# Patient Record
Sex: Female | Born: 1935 | Race: White | Hispanic: No | Marital: Married | Smoking: Never smoker
Health system: Southern US, Community
[De-identification: ages and names within clinical notes are randomized; demographics above are authoritative.]

## PROBLEM LIST (undated history)

## (undated) DIAGNOSIS — N390 Urinary tract infection, site not specified: Secondary | ICD-10-CM

## (undated) DIAGNOSIS — I639 Cerebral infarction, unspecified: Secondary | ICD-10-CM

## (undated) DIAGNOSIS — E119 Type 2 diabetes mellitus without complications: Secondary | ICD-10-CM

## (undated) DIAGNOSIS — I10 Essential (primary) hypertension: Secondary | ICD-10-CM

## (undated) HISTORY — PX: ANKLE SURGERY: SHX546

---

## 2001-05-25 ENCOUNTER — Encounter: Admission: RE | Admit: 2001-05-25 | Discharge: 2001-08-23 | Payer: Self-pay | Admitting: Pulmonary Disease

## 2002-01-04 ENCOUNTER — Ambulatory Visit (HOSPITAL_COMMUNITY): Admission: RE | Admit: 2002-01-04 | Discharge: 2002-01-04 | Payer: Self-pay | Admitting: Orthopaedic Surgery

## 2004-06-24 ENCOUNTER — Emergency Department (HOSPITAL_COMMUNITY): Admission: EM | Admit: 2004-06-24 | Discharge: 2004-06-24 | Payer: Self-pay | Admitting: Emergency Medicine

## 2007-10-12 ENCOUNTER — Ambulatory Visit: Payer: Self-pay | Admitting: Internal Medicine

## 2009-03-29 ENCOUNTER — Ambulatory Visit: Payer: Self-pay | Admitting: Internal Medicine

## 2009-03-29 DIAGNOSIS — R32 Unspecified urinary incontinence: Secondary | ICD-10-CM | POA: Insufficient documentation

## 2009-03-29 DIAGNOSIS — I10 Essential (primary) hypertension: Secondary | ICD-10-CM | POA: Insufficient documentation

## 2009-03-29 DIAGNOSIS — K219 Gastro-esophageal reflux disease without esophagitis: Secondary | ICD-10-CM | POA: Insufficient documentation

## 2009-03-29 DIAGNOSIS — E1165 Type 2 diabetes mellitus with hyperglycemia: Secondary | ICD-10-CM

## 2009-03-29 DIAGNOSIS — M1990 Unspecified osteoarthritis, unspecified site: Secondary | ICD-10-CM | POA: Insufficient documentation

## 2009-03-29 LAB — CONVERTED CEMR LAB: Hgb A1c MFr Bld: 9.8 %

## 2009-03-30 ENCOUNTER — Encounter (INDEPENDENT_AMBULATORY_CARE_PROVIDER_SITE_OTHER): Payer: Self-pay | Admitting: Internal Medicine

## 2009-03-30 LAB — CONVERTED CEMR LAB
ALT: 22 units/L (ref 0–35)
Calcium: 9.2 mg/dL (ref 8.4–10.5)
Chloride: 102 meq/L (ref 96–112)
Cholesterol: 200 mg/dL (ref 0–200)
Glucose, Bld: 189 mg/dL — ABNORMAL HIGH (ref 70–99)
HDL: 43 mg/dL (ref >39)
Total Bilirubin: 0.6 mg/dL (ref 0.3–1.2)
Triglycerides: 172 mg/dL — ABNORMAL HIGH (ref <150)

## 2009-06-28 ENCOUNTER — Ambulatory Visit: Payer: Self-pay | Admitting: Internal Medicine

## 2009-06-28 LAB — CONVERTED CEMR LAB: Hgb A1c MFr Bld: 8.3 %

## 2009-07-09 ENCOUNTER — Encounter (INDEPENDENT_AMBULATORY_CARE_PROVIDER_SITE_OTHER): Payer: Self-pay | Admitting: Internal Medicine

## 2009-07-31 ENCOUNTER — Encounter (INDEPENDENT_AMBULATORY_CARE_PROVIDER_SITE_OTHER): Payer: Self-pay | Admitting: Internal Medicine

## 2010-08-20 ENCOUNTER — Ambulatory Visit (HOSPITAL_COMMUNITY): Admission: RE | Admit: 2010-08-20 | Discharge: 2010-08-20 | Payer: Self-pay | Admitting: Ophthalmology

## 2010-10-01 ENCOUNTER — Ambulatory Visit (HOSPITAL_COMMUNITY): Admission: RE | Admit: 2010-10-01 | Discharge: 2010-10-01 | Payer: Self-pay | Admitting: Ophthalmology

## 2011-02-19 LAB — POCT I-STAT 4, (NA,K, GLUC, HGB,HCT)
Glucose, Bld: 207 mg/dL — ABNORMAL HIGH (ref 70–99)
HCT: 44 % (ref 36.0–46.0)
Potassium: 3.6 mEq/L (ref 3.5–5.1)

## 2011-02-20 LAB — POCT I-STAT 4, (NA,K, GLUC, HGB,HCT)
Potassium: 4.2 mEq/L (ref 3.5–5.1)
Sodium: 140 mEq/L (ref 135–145)

## 2011-04-25 NOTE — H&P (Signed)
[facility]  Patient:    Diane Rangel, Diane Rangel Visit Number: [id number] MRN: [id number]          Service Type: Attending:  Darreld Mclean, M.D. Dictated by:   Darreld Mclean, M.D. Adm. Date:  01/04/02                           History and Physical  CHIEF COMPLAINT:  "I have got a screw that needs to come out of my ankle."  HISTORY OF PRESENT ILLNESS:  The patient had a trimalleolar fracture of her left ankle January 2002. She was treated by Dr. Romeo Apple. A syndesmosis screw was placed across the plate to the tibia. Now the screw was very tender and very painful and she would like to have it removed. I saw her in the office on the 16th of this month and we talked about this and she elected to have this surgery.  PAST MEDICAL HISTORY:  Negative except for a history of diabetes since 1995. She has no allergies. She is currently taking Glucotrol XL 5 at 8 oclock and 6 oclock every day.  SOCIAL HISTORY:  She is a high school graduate, does not smoke and does not drink. Dr. Juanetta Gosling is her family doctor.  PAST SURGICAL HISTORY:  Other than ankle surgery last year, no other surgeries.  FAMILY HISTORY:  She denies any diseases that run in the family.  PHYSICAL EXAMINATION:  VITAL SIGNS:  Within normal limits.  HEENT:  Negative.  NECK:  Supple.  LUNGS:  Clear to auscultation and percussion.  HEART:  Regular rate and rhythm without murmur heard.  ABDOMEN:  Nontender without masses.  EXTREMITIES:  The left ankle has well healed scars at the prominence of the proximal syndesmosis screw laterally. Good range of motion and good gait.  CNS:  ______  IMPRESSION:  Status post trimalleolar fracture of the left ankle now for screw removal. I have discussed with the patient the planned procedure risks and ______.  She does not want the plate or the other screws removed at this time. Her labs are pending. Dictated by:   Darreld Mclean, M.D. Attending:  Darreld Mclean, M.D. DD:  01/03/02 TD:  01/03/02 Job: [id number]

## 2011-04-25 NOTE — Op Note (Signed)
[facility]  Patient:    Diane Rangel, Diane Rangel Visit Number: [id number] MRN: [id number]          Service Type: OUT Location: DAY Attending Physician:  Windle Guard Dictated by:   Darreld Mclean, M.D. Admit Date:  01/04/2002                             Operative Report  PREOPERATIVE DIAGNOSIS:  Status post trimalleolar fracture of the left ankle, now for syndesmosis screw removal laterally.  POSTOPERATIVE DIAGNOSIS:  Status post trimalleolar fracture of the left ankle, now for syndesmosis screw removal laterally.  PROCEDURE:  Removal of syndesmosis screw, left ankle laterally.  ANESTHESIA:  General.  TOURNIQUET TIME:  10 minutes.  DRAINS:  No drains used.  SURGEON:  Darreld Mclean, M.D.  INDICATIONS:  The patient had trimalleolar fracture last year to the left ankle.  She has done well.  The fracture is healed.  She has a syndesmosis screw in place that is rather prominent.  As the swelling has gone down in her ankle, the screw is prominent, irritating and rubbing against her shoe.  She would like to have this removed.  It is very symptomatic.  The risks _____ of the procedure discussed with the patient.  Consent was signed and she agreed to the procedure as outlined.  DESCRIPTION OF PROCEDURE:  The patient was placed supine on the operating room table.  Tourniquet placed, _____ left upper thigh.  She was given general anesthesia.  Sandbag placed up under the padding of the table on the left hip area.  The patient was prepped and draped in the usual manner.  Leg was elevated and tourniquet inflated to 250 mmHg.  A small incision was made in the previous incision directly over the screw head area.  The screw head was exposed and removed with a large screwdriver.  The wound was then reapproximated with skin staples.  Tourniquet was deflated after 10 minutes. Sterile dressing applied, bulky dressing applied, Ace bandage applied.  The patient tolerated the  procedure well and went to recovery in good condition. Appropriate analgesia given for pain.  I will see the patient in the office in approximately two weeks.  She can weight bear on it in a few days and she can change the dressing to a small Band-Aid dressing.  For any difficulty, she is to contact me through the office or hospital _____ .  Numbers have been provided. Dictated by:   Darreld Mclean, M.D. Attending Physician:  Windle Guard DD:  01/04/02 TD:  01/04/02 Job: [id number]

## 2012-12-19 ENCOUNTER — Encounter (HOSPITAL_COMMUNITY): Payer: Self-pay

## 2012-12-19 ENCOUNTER — Other Ambulatory Visit: Payer: Self-pay

## 2012-12-19 ENCOUNTER — Emergency Department (HOSPITAL_COMMUNITY): Payer: No Typology Code available for payment source

## 2012-12-19 ENCOUNTER — Emergency Department (HOSPITAL_COMMUNITY)
Admission: EM | Admit: 2012-12-19 | Discharge: 2012-12-19 | Disposition: A | Discharge status: Home / Self Care | Payer: No Typology Code available for payment source | Attending: Emergency Medicine | Admitting: Emergency Medicine

## 2012-12-19 DIAGNOSIS — I10 Essential (primary) hypertension: Secondary | ICD-10-CM | POA: Insufficient documentation

## 2012-12-19 DIAGNOSIS — E119 Type 2 diabetes mellitus without complications: Secondary | ICD-10-CM | POA: Insufficient documentation

## 2012-12-19 DIAGNOSIS — Y92410 Unspecified street and highway as the place of occurrence of the external cause: Secondary | ICD-10-CM | POA: Insufficient documentation

## 2012-12-19 DIAGNOSIS — Y9389 Activity, other specified: Secondary | ICD-10-CM | POA: Insufficient documentation

## 2012-12-19 DIAGNOSIS — S80219A Abrasion, unspecified knee, initial encounter: Secondary | ICD-10-CM

## 2012-12-19 DIAGNOSIS — R079 Chest pain, unspecified: Secondary | ICD-10-CM

## 2012-12-19 DIAGNOSIS — S8000XA Contusion of unspecified knee, initial encounter: Secondary | ICD-10-CM | POA: Insufficient documentation

## 2012-12-19 DIAGNOSIS — S298XXA Other specified injuries of thorax, initial encounter: Secondary | ICD-10-CM | POA: Insufficient documentation

## 2012-12-19 HISTORY — DX: Essential (primary) hypertension: I10

## 2012-12-19 HISTORY — DX: Type 2 diabetes mellitus without complications: E11.9
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
## 2014-03-26 ENCOUNTER — Emergency Department (HOSPITAL_COMMUNITY): Payer: Medicare Other

## 2014-03-26 ENCOUNTER — Inpatient Hospital Stay (HOSPITAL_COMMUNITY)
Admission: EM | Admit: 2014-03-26 | Discharge: 2014-03-31 | DRG: 871 | Disposition: A | Discharge status: Skilled Nursing Facility | Payer: Medicare Other | Attending: Internal Medicine | Admitting: Internal Medicine

## 2014-03-26 ENCOUNTER — Encounter (HOSPITAL_COMMUNITY): Payer: Self-pay | Admitting: Emergency Medicine

## 2014-03-26 DIAGNOSIS — I6350 Cerebral infarction due to unspecified occlusion or stenosis of unspecified cerebral artery: Secondary | ICD-10-CM | POA: Diagnosis present

## 2014-03-26 DIAGNOSIS — G9341 Metabolic encephalopathy: Secondary | ICD-10-CM | POA: Diagnosis not present

## 2014-03-26 DIAGNOSIS — K529 Noninfective gastroenteritis and colitis, unspecified: Secondary | ICD-10-CM | POA: Diagnosis present

## 2014-03-26 DIAGNOSIS — G8190 Hemiplegia, unspecified affecting unspecified side: Secondary | ICD-10-CM | POA: Diagnosis present

## 2014-03-26 DIAGNOSIS — K219 Gastro-esophageal reflux disease without esophagitis: Secondary | ICD-10-CM

## 2014-03-26 DIAGNOSIS — J189 Pneumonia, unspecified organism: Secondary | ICD-10-CM | POA: Diagnosis not present

## 2014-03-26 DIAGNOSIS — R29810 Facial weakness: Secondary | ICD-10-CM | POA: Diagnosis present

## 2014-03-26 DIAGNOSIS — G8929 Other chronic pain: Secondary | ICD-10-CM | POA: Diagnosis present

## 2014-03-26 DIAGNOSIS — E1165 Type 2 diabetes mellitus with hyperglycemia: Secondary | ICD-10-CM

## 2014-03-26 DIAGNOSIS — E870 Hyperosmolality and hypernatremia: Secondary | ICD-10-CM | POA: Diagnosis present

## 2014-03-26 DIAGNOSIS — G9340 Encephalopathy, unspecified: Secondary | ICD-10-CM | POA: Diagnosis present

## 2014-03-26 DIAGNOSIS — A419 Sepsis, unspecified organism: Secondary | ICD-10-CM | POA: Diagnosis present

## 2014-03-26 DIAGNOSIS — Z515 Encounter for palliative care: Secondary | ICD-10-CM

## 2014-03-26 DIAGNOSIS — K589 Irritable bowel syndrome without diarrhea: Secondary | ICD-10-CM | POA: Diagnosis present

## 2014-03-26 DIAGNOSIS — E669 Obesity, unspecified: Secondary | ICD-10-CM | POA: Diagnosis present

## 2014-03-26 DIAGNOSIS — B370 Candidal stomatitis: Secondary | ICD-10-CM | POA: Diagnosis not present

## 2014-03-26 DIAGNOSIS — Z6835 Body mass index (BMI) 35.0-35.9, adult: Secondary | ICD-10-CM

## 2014-03-26 DIAGNOSIS — B961 Klebsiella pneumoniae [K. pneumoniae] as the cause of diseases classified elsewhere: Secondary | ICD-10-CM | POA: Diagnosis present

## 2014-03-26 DIAGNOSIS — IMO0001 Reserved for inherently not codable concepts without codable children: Secondary | ICD-10-CM | POA: Diagnosis present

## 2014-03-26 DIAGNOSIS — K5289 Other specified noninfective gastroenteritis and colitis: Secondary | ICD-10-CM | POA: Diagnosis present

## 2014-03-26 DIAGNOSIS — K5900 Constipation, unspecified: Secondary | ICD-10-CM | POA: Diagnosis present

## 2014-03-26 DIAGNOSIS — I639 Cerebral infarction, unspecified: Secondary | ICD-10-CM

## 2014-03-26 DIAGNOSIS — R4182 Altered mental status, unspecified: Secondary | ICD-10-CM

## 2014-03-26 DIAGNOSIS — Z66 Do not resuscitate: Secondary | ICD-10-CM | POA: Diagnosis present

## 2014-03-26 DIAGNOSIS — N390 Urinary tract infection, site not specified: Secondary | ICD-10-CM | POA: Diagnosis present

## 2014-03-26 DIAGNOSIS — R55 Syncope and collapse: Secondary | ICD-10-CM | POA: Diagnosis present

## 2014-03-26 DIAGNOSIS — M1990 Unspecified osteoarthritis, unspecified site: Secondary | ICD-10-CM

## 2014-03-26 DIAGNOSIS — N179 Acute kidney failure, unspecified: Secondary | ICD-10-CM | POA: Diagnosis not present

## 2014-03-26 DIAGNOSIS — I10 Essential (primary) hypertension: Secondary | ICD-10-CM | POA: Diagnosis present

## 2014-03-26 DIAGNOSIS — G936 Cerebral edema: Secondary | ICD-10-CM | POA: Diagnosis not present

## 2014-03-26 DIAGNOSIS — R32 Unspecified urinary incontinence: Secondary | ICD-10-CM

## 2014-03-26 DIAGNOSIS — R197 Diarrhea, unspecified: Secondary | ICD-10-CM

## 2014-03-26 DIAGNOSIS — E119 Type 2 diabetes mellitus without complications: Secondary | ICD-10-CM | POA: Insufficient documentation

## 2014-03-26 HISTORY — DX: Urinary tract infection, site not specified: N39.0

## 2014-03-26 HISTORY — DX: Cerebral infarction, unspecified: I63.9

## 2014-03-26 LAB — CLOSTRIDIUM DIFFICILE BY PCR: Toxigenic C. Difficile by PCR: NEGATIVE

## 2014-03-26 LAB — RAPID URINE DRUG SCREEN, HOSP PERFORMED
Amphetamines: NOT DETECTED
BENZODIAZEPINES: NOT DETECTED
Barbiturates: NOT DETECTED
COCAINE: NOT DETECTED
Opiates: NOT DETECTED
TETRAHYDROCANNABINOL: NOT DETECTED

## 2014-03-26 LAB — TROPONIN I

## 2014-03-26 LAB — CBC WITH DIFFERENTIAL/PLATELET
Basophils Absolute: 0 10*3/uL (ref 0.0–0.1)
Basophils Relative: 0 % (ref 0–1)
EOS PCT: 1 % (ref 0–5)
Eosinophils Absolute: 0.2 10*3/uL (ref 0.0–0.7)
HEMATOCRIT: 49.5 % — AB (ref 36.0–46.0)
Hemoglobin: 17 g/dL — ABNORMAL HIGH (ref 12.0–15.0)
LYMPHS PCT: 10 % — AB (ref 12–46)
Lymphs Abs: 1.9 10*3/uL (ref 0.7–4.0)
MCH: 30.2 pg (ref 26.0–34.0)
MCHC: 34.3 g/dL (ref 30.0–36.0)
MCV: 88.1 fL (ref 78.0–100.0)
MONOS PCT: 5 % (ref 3–12)
Monocytes Absolute: 1.1 10*3/uL — ABNORMAL HIGH (ref 0.1–1.0)
NEUTROS PCT: 84 % — AB (ref 43–77)
Neutro Abs: 17.3 10*3/uL — ABNORMAL HIGH (ref 1.7–7.7)
Platelets: 279 10*3/uL (ref 150–400)
RBC: 5.62 MIL/uL — AB (ref 3.87–5.11)
RDW: 13.8 % (ref 11.5–15.5)
WBC: 20.5 10*3/uL — ABNORMAL HIGH (ref 4.0–10.5)

## 2014-03-26 LAB — COMPREHENSIVE METABOLIC PANEL
ALK PHOS: 83 U/L (ref 39–117)
ALT: 19 U/L (ref 0–35)
AST: 34 U/L (ref 0–37)
Albumin: 3.5 g/dL (ref 3.5–5.2)
BUN: 24 mg/dL — ABNORMAL HIGH (ref 6–23)
CHLORIDE: 96 meq/L (ref 96–112)
CO2: 21 meq/L (ref 19–32)
CREATININE: 0.91 mg/dL (ref 0.50–1.10)
Calcium: 9.6 mg/dL (ref 8.4–10.5)
GFR, EST AFRICAN AMERICAN: 68 mL/min — AB (ref >90)
GFR, EST NON AFRICAN AMERICAN: 59 mL/min — AB (ref >90)
Glucose, Bld: 264 mg/dL — ABNORMAL HIGH (ref 70–99)
POTASSIUM: 4 meq/L (ref 3.7–5.3)
Sodium: 138 mEq/L (ref 137–147)
TOTAL PROTEIN: 7.5 g/dL (ref 6.0–8.3)
Total Bilirubin: 0.7 mg/dL (ref 0.3–1.2)

## 2014-03-26 LAB — URINALYSIS, ROUTINE W REFLEX MICROSCOPIC
GLUCOSE, UA: NEGATIVE mg/dL
KETONES UR: NEGATIVE mg/dL
Nitrite: NEGATIVE
PH: 8 (ref 5.0–8.0)
PROTEIN: 30 mg/dL — AB
SPECIFIC GRAVITY, URINE: 1.015 (ref 1.005–1.030)
UROBILINOGEN UA: 4 mg/dL — AB (ref 0.0–1.0)

## 2014-03-26 LAB — LIPASE, BLOOD: LIPASE: 45 U/L (ref 11–59)

## 2014-03-26 LAB — PRO B NATRIURETIC PEPTIDE: PRO B NATRI PEPTIDE: 231.2 pg/mL (ref 0–450)

## 2014-03-26 LAB — URINE MICROSCOPIC-ADD ON

## 2014-03-26 LAB — ACETAMINOPHEN LEVEL: Acetaminophen (Tylenol), Serum: <15 ug/mL (ref 10–30)

## 2014-03-26 LAB — LACTIC ACID, PLASMA: LACTIC ACID, VENOUS: 2.7 mmol/L — AB (ref 0.5–2.2)

## 2014-03-26 MED ORDER — SODIUM CHLORIDE 0.9 % IV BOLUS (SEPSIS): 1000.0000 mL | Freq: Once | INTRAVENOUS | Status: DC

## 2014-03-26 MED ORDER — ONDANSETRON HCL 4 MG/2ML IJ SOLN
4.0000 mg | Freq: Once | INTRAMUSCULAR | Status: AC
  Administered 2014-03-26: 4 mg via INTRAVENOUS
  Filled 2014-03-26: qty 2

## 2014-03-26 MED ORDER — SODIUM CHLORIDE 0.9 % IV BOLUS (SEPSIS)
1000.0000 mL | Freq: Once | INTRAVENOUS | Status: AC
  Administered 2014-03-26: 1000 mL via INTRAVENOUS

## 2014-03-26 MED ORDER — DEXTROSE 5 % IV SOLN
1.0000 g | Freq: Once | INTRAVENOUS | Status: AC
  Administered 2014-03-27: 1 g via INTRAVENOUS
  Filled 2014-03-26: qty 10

## 2014-03-26 NOTE — ED Provider Notes (Signed)
CSN: [id number]     Arrival date & time 03/26/14  2026 History  This chart was scribed for Gilda Creasehristopher J. Pollina, * by Danella Maiersaroline Early, ED Scribe. This patient was seen in room APA05/APA05 and the patient's care was started at 8:34 PM.    Chief Complaint  Patient presents with  . Drug Overdose   The history is provided by the patient. No language interpreter was used.   HPI Comments: Diane Rangel is a 80 y.o. female brought in by ambulance who presents to the Emergency Department complaining of AMS onset tonight while laying in bed. Granddaughter present, states she has been with the pt all day. States pt was acting normally all day until 6pm when she complained of constipation. 30 minutes later she was having severe, constant diarrhea and resulting weakness. Then she starrted having slurred speech and then became completely unresponsive. Eyes were open. Granddaughter states at baseline pt is very loud and active and this is extremely unusual behavior. Per EMS, pt was unresponsive on arrival, given 1mg  Narcan with immediate response. Granddaughter states pt is usually very good about taking the appropriate doses of her medications. Pt vomited on arrival here. Pt also with bowel incontinence here.   Past Medical History  Diagnosis Date  . Hypertension   . Diabetes mellitus without complication    Past Surgical History  Procedure Laterality Date  . Ankle surgery     No family history on file. History  Substance Use Topics  . Smoking status: Never Smoker   . Smokeless tobacco: Not on file  . Alcohol Use: No   OB History   Grav Para Term Preterm Abortions TAB SAB Ect Mult Living                 Review of Systems  Gastrointestinal: Positive for vomiting and diarrhea.  Neurological: Positive for speech difficulty and weakness.  All other systems reviewed and are negative.     Allergies  Review of patient's allergies indicates no known allergies.  Home Medications   Prior to  Admission medications   Not on File   BP 116/64  Pulse 104  Resp 24  SpO2 94% Physical Exam  Constitutional: She is oriented to person, place, and time. She appears well-developed and well-nourished. No distress.  Sedated, sleepy, awakens to voice, follows commands.  HENT:  Head: Normocephalic and atraumatic.  Right Ear: Hearing normal.  Left Ear: Hearing normal.  Nose: Nose normal.  Mouth/Throat: Oropharynx is clear and moist and mucous membranes are normal.  Eyes: Conjunctivae and EOM are normal. Pupils are equal, round, and reactive to light.  Neck: Normal range of motion. Neck supple.  Cardiovascular: Regular rhythm, S1 normal and S2 normal.  Tachycardia present.  Exam reveals no gallop and no friction rub.   No murmur heard. Pulmonary/Chest: Tachypnea noted. No respiratory distress. She has rales (basilar bilaterally). She exhibits no tenderness.  Abdominal: Soft. Normal appearance and bowel sounds are normal. There is no hepatosplenomegaly. There is no tenderness. There is no rebound, no guarding, no tenderness at McBurney's point and negative Murphy's sign. No hernia.  Musculoskeletal: Normal range of motion.  Neurological: She is alert and oriented to person, place, and time. She has normal strength. No cranial nerve deficit or sensory deficit. Coordination normal. GCS eye subscore is 4. GCS verbal subscore is 5. GCS motor subscore is 6.  Skin: Skin is warm, dry and intact. No rash noted. No cyanosis.  Psychiatric: She has a normal mood and  affect. Her speech is normal and behavior is normal. Thought content normal.    ED Course  Procedures (including critical care time) Medications - No data to display  DIAGNOSTIC STUDIES: Oxygen Saturation is 94% on  RA    COORDINATION OF CARE: 8:46 PM- Discussed treatment plan with pt. Pt agrees to plan.    Labs Review Labs Reviewed  CBC WITH DIFFERENTIAL - Abnormal; Notable for the following:    WBC 20.5 (*)    RBC 5.62 (*)     Hemoglobin 17.0 (*)    HCT 49.5 (*)    Neutrophils Relative % 84 (*)    Neutro Abs 17.3 (*)    Lymphocytes Relative 10 (*)    Monocytes Absolute 1.1 (*)    All other components within normal limits  COMPREHENSIVE METABOLIC PANEL - Abnormal; Notable for the following:    Glucose, Bld 264 (*)    BUN 24 (*)    GFR calc non Af Amer 59 (*)    GFR calc Af Amer 68 (*)    All other components within normal limits  LACTIC ACID, PLASMA - Abnormal; Notable for the following:    Lactic Acid, Venous 2.7 (*)    All other components within normal limits  CLOSTRIDIUM DIFFICILE BY PCR  TROPONIN I  ACETAMINOPHEN LEVEL  PRO B NATRIURETIC PEPTIDE  URINALYSIS, ROUTINE W REFLEX MICROSCOPIC  URINE RAPID DRUG SCREEN (HOSP PERFORMED)  GI PATHOGEN PANEL BY PCR, STOOL  POC OCCULT BLOOD, ED    Imaging Review Dg Chest Port 1 View  03/26/2014   CLINICAL DATA:  Shortness of Breath  EXAM: PORTABLE CHEST - 1 VIEW  COMPARISON:  December 19, 2012  FINDINGS: There is no edema or consolidation. Heart is mildly enlarged with normal pulmonary vascularity. No adenopathy. There is degenerative change in the thoracic spine.  IMPRESSION: Mild cardiac enlargement.  No edema or consolidation.   Electronically Signed   By: Bretta Bang M.D.   On: 03/26/2014 21:18     EKG Interpretation   Date/Time:  Sunday March 26 2014 21:08:39 EDT Ventricular Rate:  110 PR Interval:  148 QRS Duration: 90 QT Interval:  342 QTC Calculation: 462 R Axis:   -12 Text Interpretation:  Sinus tachycardia Biatrial enlargement Abnormal ECG  When compared with ECG of 19-Dec-2012 20:41, No significant change was  found Confirmed by POLLINA  MD, CHRISTOPHER [phone]) on 03/26/2014 9:13:41  PM      MDM   Final diagnoses:  Mental status change  Diarrhea  Syncope   Patient brought to the ER for evaluation of acute mental status changes, intractable diarrhea. Patient started feeling poorly just before coming to the ER. She started to  complain of abdominal discomfort and then had explosive diarrhea. She has had multiple episodes of uncontrollable diarrhea followed by a period of syncope and unresponsiveness.  EMS felt that the patient might have overdosed on her hydrocodone, but the patient's granddaughter insist that she only took 2 tablets. She has a history of chronic left hip pain for which she occasionally uses pain medication.  Patient has a significantly elevated white blood cell count. BUN is slightly elevated, the remainder of the conference of metabolic panel was normal. Patient is tachycardic, likely secondary to the acute illness. There is no fever. Her troponin is negative. No signs of ischemia or infarct.  Patient did have an increased respiratory rate at arrival. Her oxygen saturation was low initially for EMS, improved after the Narcan and being placed on oxygen.  On nasal cannula oxygen she is 9495%. Lung sounds are exhibited rhonchi and rales. She'll vomit in the handouts on the way and I am concerned about possible aspiration.  Stool sample sent for pathogen panel.  Patient will require admission for further management  I personally performed the services described in this documentation, which was scribed in my presence. The recorded information has been reviewed and is accurate.    Gilda Creasehristopher J. Pollina, MD 03/26/14 2153

## 2014-03-26 NOTE — H&P (Signed)
PCP:   Ernestine ConradBLUTH, KIRK, MD   Chief Complaint:  N/v/d  HPI: 80 yo female was about to wash some dishes tonight when she suddenly started having profuse diarrhea.  Lives with granddtr.  She has been fighting constipation for several days.  Did not take anything for it today but did take some yesterday mirilax.  [place] and kept having so much diarrhea, that she got very weak and her granddtr had to help.  After about her fourth bout of diarrhea, she got very weak and passed out.  Family called ems.  She came too quickly.  On arrival to ED she vomited, and since has had several more episodes of diarrhea.  No fevers.  Denies abd pain.  No sick contacts.  No bleeding.  No focal neuro deficits.  Has now a rectal tube with a lot of brown fecal material.    Review of Systems:  Positive and negative as per HPI otherwise all other systems are negative  Past Medical History: Past Medical History  Diagnosis Date  . Hypertension   . Diabetes mellitus without complication    Past Surgical History  Procedure Laterality Date  . Ankle surgery      Medications: Prior to Admission medications   Medication Sig Start Date End Date Taking? Authorizing Provider  AMITIZA 8 MCG capsule Take 1 capsule by mouth 2 (two) times daily. 01/30/14   Historical Provider, MD  amLODipine (NORVASC) 10 MG tablet Take 10 mg by mouth daily. 03/03/14   Historical Provider, MD  lisinopril-hydrochlorothiazide (PRINZIDE,ZESTORETIC) 20-12.5 MG per tablet Take 1 tablet by mouth daily. 03/07/14   Historical Provider, MD  naproxen (NAPROSYN) 500 MG tablet Take 500 mg by mouth 2 (two) times daily with a meal. 03/04/14   Historical Provider, MD  NOVOLIN N RELION 100 UNIT/ML injection  03/08/14   Historical Provider, MD  polyethylene glycol powder (GLYCOLAX/MIRALAX) powder Take 17 g by mouth daily. 02/24/14   Historical Provider, MD  ranitidine (ZANTAC) 150 MG tablet Take 150 mg by mouth 2 (two) times daily. 01/21/14   Historical  Provider, MD  traMADol (ULTRAM) 50 MG tablet Take 50 mg by mouth every 6 (six) hours as needed. For pain 02/15/14   Historical Provider, MD    Allergies:  No Known Allergies  Social History:  reports that she has never smoked. She does not have any smokeless tobacco history on file. She reports that she does not drink alcohol or use illicit drugs.  Family History: none  Physical Exam: Filed Vitals:   03/26/14 2058  BP: 116/64  Pulse: 104  Resp: 24  SpO2: 94%   General appearance: alert, cooperative and no distress Head: Normocephalic, without obvious abnormality, atraumatic Eyes: negative Nose: Nares normal. Septum midline. Mucosa normal. No drainage or sinus tenderness. Neck: no JVD and supple, symmetrical, trachea midline Lungs: clear to auscultation bilaterally Heart: regular rate and rhythm, S1, S2 normal, no murmur, click, rub or gallop Abdomen: soft, non-tender; bowel sounds normal; no masses,  no organomegaly Extremities: extremities normal, atraumatic, no cyanosis or edema Pulses: 2+ and symmetric Skin: Skin color, texture, turgor normal. No rashes or lesions Neurologic: Grossly normal  Labs on Admission:   Recent Labs  03/26/14 2049  NA 138  K 4.0  CL 96  CO2 21  GLUCOSE 264*  BUN 24*  CREATININE 0.91  CALCIUM 9.6    Recent Labs  03/26/14 2049  AST 34  ALT 19  ALKPHOS 83  BILITOT 0.7  PROT  7.5  ALBUMIN 3.5    Recent Labs  03/26/14 2049  WBC 20.5*  NEUTROABS 17.3*  HGB 17.0*  HCT 49.5*  MCV 88.1  PLT 279    Recent Labs  03/26/14 2049  TROPONINI <0.30    Radiological Exams on Admission: Dg Chest Port 1 View  03/26/2014   CLINICAL DATA:  Shortness of Breath  EXAM: PORTABLE CHEST - 1 VIEW  COMPARISON:  December 19, 2012  FINDINGS: There is no edema or consolidation. Heart is mildly enlarged with normal pulmonary vascularity. No adenopathy. There is degenerative change in the thoracic spine.  IMPRESSION: Mild cardiac enlargement.  No  edema or consolidation.   Electronically Signed   By: Bretta BangWilliam  Woodruff M.D.   On: 03/26/2014 21:18   Dg Abd 2 Views  03/26/2014   CLINICAL DATA:  Diarrhea  EXAM: ABDOMEN - 2 VIEW  COMPARISON:  None.  FINDINGS: Study is limited due to body habitus. Gaseous distention of colon and possibly small bowel is present. Left pelvic phlebolith. No obvious free intraperitoneal gas.  IMPRESSION: Limited exam.  Distended loops of colon are nonspecific.   Electronically Signed   By: Maryclare BeanArt  Hoss M.D.   On: 03/26/2014 23:29    Assessment/Plan  80 yo female with acute onset of copious diarrhea who then probably had a vasovagal syncopal event  Principal Problem:   Gastroenteritis, acute probable.  Could just be the meds she has been taking for constipation have finally started working.  Has gotten 2 Liter ivf.  Given one more.  Supportive care with zofran also.  i have seen several cases of VGE here in last several days.    Active Problems:   DIABETES MELLITUS, TYPE II, UNCONTROLLED   UTI (lower urinary tract infection)-  rocephin   Syncope, vasovagal  Obs on med surg.    [place] A Onalee HuaDavid 03/26/2014, 11:36 PM

## 2014-03-26 NOTE — ED Notes (Signed)
Pt from home via ems, reportedly was unresponsive per paramedic on their arrival, given 1 mg Narcan with immediate response.  Pt vomited on arrival here, nad

## 2014-03-27 ENCOUNTER — Observation Stay (HOSPITAL_COMMUNITY): Payer: Medicare Other

## 2014-03-27 ENCOUNTER — Inpatient Hospital Stay (HOSPITAL_COMMUNITY): Payer: Medicare Other

## 2014-03-27 DIAGNOSIS — G9340 Encephalopathy, unspecified: Secondary | ICD-10-CM

## 2014-03-27 DIAGNOSIS — A419 Sepsis, unspecified organism: Secondary | ICD-10-CM | POA: Diagnosis present

## 2014-03-27 DIAGNOSIS — I517 Cardiomegaly: Secondary | ICD-10-CM

## 2014-03-27 DIAGNOSIS — N179 Acute kidney failure, unspecified: Secondary | ICD-10-CM

## 2014-03-27 DIAGNOSIS — I639 Cerebral infarction, unspecified: Secondary | ICD-10-CM | POA: Diagnosis present

## 2014-03-27 LAB — GI PATHOGEN PANEL BY PCR, STOOL
C difficile toxin A/B: NEGATIVE
CRYPTOSPORIDIUM BY PCR: NEGATIVE
Campylobacter by PCR: NEGATIVE
E COLI (ETEC) LT/ST: NEGATIVE
E COLI (STEC): NEGATIVE
E COLI 0157 BY PCR: NEGATIVE
G lamblia by PCR: NEGATIVE
Norovirus GI/GII: NEGATIVE
Rotavirus A by PCR: NEGATIVE
SHIGELLA BY PCR: NEGATIVE
Salmonella by PCR: NEGATIVE

## 2014-03-27 LAB — LIPID PANEL
CHOL/HDL RATIO: 4.6 ratio
Cholesterol: 153 mg/dL (ref 0–200)
HDL: 33 mg/dL — ABNORMAL LOW (ref >39)
LDL Cholesterol: 91 mg/dL (ref 0–99)
Triglycerides: 144 mg/dL (ref <150)
VLDL: 29 mg/dL (ref 0–40)

## 2014-03-27 LAB — GLUCOSE, CAPILLARY
GLUCOSE-CAPILLARY: 282 mg/dL — AB (ref 70–99)
Glucose-Capillary: 341 mg/dL — ABNORMAL HIGH (ref 70–99)
Glucose-Capillary: 238 mg/dL — ABNORMAL HIGH (ref 70–99)

## 2014-03-27 LAB — CBC
HCT: 47.1 % — ABNORMAL HIGH (ref 36.0–46.0)
HEMOGLOBIN: 16 g/dL — AB (ref 12.0–15.0)
MCH: 30.2 pg (ref 26.0–34.0)
MCHC: 34 g/dL (ref 30.0–36.0)
MCV: 88.9 fL (ref 78.0–100.0)
Platelets: 244 10*3/uL (ref 150–400)
RBC: 5.3 MIL/uL — ABNORMAL HIGH (ref 3.87–5.11)
RDW: 14.1 % (ref 11.5–15.5)
WBC: 24.9 10*3/uL — ABNORMAL HIGH (ref 4.0–10.5)

## 2014-03-27 LAB — OCCULT BLOOD, POC DEVICE: Fecal Occult Bld: POSITIVE — AB

## 2014-03-27 LAB — BASIC METABOLIC PANEL
BUN: 32 mg/dL — ABNORMAL HIGH (ref 6–23)
CALCIUM: 8.7 mg/dL (ref 8.4–10.5)
CO2: 22 meq/L (ref 19–32)
Chloride: 98 mEq/L (ref 96–112)
Creatinine, Ser: 1.3 mg/dL — ABNORMAL HIGH (ref 0.50–1.10)
GFR calc Af Amer: 44 mL/min — ABNORMAL LOW (ref >90)
GFR calc non Af Amer: 38 mL/min — ABNORMAL LOW (ref >90)
Glucose, Bld: 304 mg/dL — ABNORMAL HIGH (ref 70–99)
POTASSIUM: 4.7 meq/L (ref 3.7–5.3)
SODIUM: 139 meq/L (ref 137–147)

## 2014-03-27 LAB — HEMOGLOBIN A1C
Hgb A1c MFr Bld: 6.8 % — ABNORMAL HIGH (ref <5.7)
Mean Plasma Glucose: 148 mg/dL — ABNORMAL HIGH (ref <117)

## 2014-03-27 MED ORDER — AMLODIPINE BESYLATE 5 MG PO TABS: 10.0000 mg | ORAL_TABLET | Freq: Every day | ORAL | Status: DC

## 2014-03-27 MED ORDER — ONDANSETRON HCL 4 MG/2ML IJ SOLN
4.0000 mg | Freq: Four times a day (QID) | INTRAMUSCULAR | Status: DC | PRN
  Administered 2014-03-28: 4 mg via INTRAVENOUS
  Filled 2014-03-27: qty 2

## 2014-03-27 MED ORDER — BIOTENE DRY MOUTH MT LIQD
15.0000 mL | Freq: Two times a day (BID) | OROMUCOSAL | Status: DC
  Administered 2014-03-27 – 2014-03-31 (×7): 15 mL via OROMUCOSAL

## 2014-03-27 MED ORDER — LISINOPRIL-HYDROCHLOROTHIAZIDE 20-12.5 MG PO TABS: 1.0000 | ORAL_TABLET | Freq: Every day | ORAL | Status: DC

## 2014-03-27 MED ORDER — INSULIN ASPART 100 UNIT/ML ~~LOC~~ SOLN
0.0000 [IU] | Freq: Three times a day (TID) | SUBCUTANEOUS | Status: DC
  Administered 2014-03-27: 15 [IU] via SUBCUTANEOUS
  Administered 2014-03-27: 11 [IU] via SUBCUTANEOUS
  Administered 2014-03-28: 7 [IU] via SUBCUTANEOUS

## 2014-03-27 MED ORDER — PIPERACILLIN-TAZOBACTAM 3.375 G IVPB
3.3750 g | Freq: Three times a day (TID) | INTRAVENOUS | Status: DC
  Administered 2014-03-27 – 2014-03-31 (×12): 3.375 g via INTRAVENOUS
  Filled 2014-03-27 (×14): qty 50

## 2014-03-27 MED ORDER — INSULIN ASPART 100 UNIT/ML ~~LOC~~ SOLN
0.0000 [IU] | Freq: Every day | SUBCUTANEOUS | Status: DC
  Administered 2014-03-27: 2 [IU] via SUBCUTANEOUS

## 2014-03-27 MED ORDER — DEXTROSE 5 % IV SOLN: 1.0000 g | Freq: Every day | INTRAVENOUS | Status: DC

## 2014-03-27 MED ORDER — SODIUM CHLORIDE 0.9 % IV SOLN: 250.0000 mL | INTRAVENOUS | Status: DC | PRN

## 2014-03-27 MED ORDER — LISINOPRIL 10 MG PO TABS: 20.0000 mg | ORAL_TABLET | Freq: Every day | ORAL | Status: DC

## 2014-03-27 MED ORDER — POLYETHYLENE GLYCOL 3350 17 GM/SCOOP PO POWD
17.0000 g | Freq: Every day | ORAL | Status: DC
  Filled 2014-03-27: qty 255

## 2014-03-27 MED ORDER — LUBIPROSTONE 8 MCG PO CAPS
8.0000 ug | ORAL_CAPSULE | Freq: Two times a day (BID) | ORAL | Status: DC
  Filled 2014-03-27 (×2): qty 1

## 2014-03-27 MED ORDER — NAPROXEN 250 MG PO TABS: 500.0000 mg | ORAL_TABLET | Freq: Two times a day (BID) | ORAL | Status: DC

## 2014-03-27 MED ORDER — HYDROCHLOROTHIAZIDE 12.5 MG PO CAPS: 12.5000 mg | ORAL_CAPSULE | Freq: Every day | ORAL | Status: DC

## 2014-03-27 MED ORDER — ACETAMINOPHEN 10 MG/ML IV SOLN
1000.0000 mg | Freq: Four times a day (QID) | INTRAVENOUS | Status: AC
  Administered 2014-03-28 (×4): 1000 mg via INTRAVENOUS
  Filled 2014-03-27 (×4): qty 100

## 2014-03-27 MED ORDER — ASPIRIN 300 MG RE SUPP
300.0000 mg | Freq: Every day | RECTAL | Status: DC
  Administered 2014-03-27 – 2014-03-31 (×3): 300 mg via RECTAL
  Filled 2014-03-27 (×8): qty 1

## 2014-03-27 MED ORDER — SODIUM CHLORIDE 0.9 % IJ SOLN: 3.0000 mL | INTRAMUSCULAR | Status: DC | PRN

## 2014-03-27 MED ORDER — CHLORHEXIDINE GLUCONATE 0.12 % MT SOLN
15.0000 mL | Freq: Two times a day (BID) | OROMUCOSAL | Status: DC
  Administered 2014-03-27 – 2014-03-31 (×5): 15 mL via OROMUCOSAL
  Filled 2014-03-27 (×6): qty 15

## 2014-03-27 MED ORDER — ASPIRIN 325 MG PO TABS: 325.0000 mg | ORAL_TABLET | Freq: Every day | ORAL | Status: DC

## 2014-03-27 MED ORDER — SODIUM CHLORIDE 0.9 % IV SOLN
INTRAVENOUS | Status: DC
  Administered 2014-03-27 – 2014-03-30 (×5): via INTRAVENOUS

## 2014-03-27 MED ORDER — ONDANSETRON HCL 4 MG PO TABS: 4.0000 mg | ORAL_TABLET | Freq: Four times a day (QID) | ORAL | Status: DC | PRN

## 2014-03-27 MED ORDER — ACETAMINOPHEN 10 MG/ML IV SOLN
INTRAVENOUS | Status: AC
  Filled 2014-03-27: qty 200

## 2014-03-27 MED ORDER — SODIUM CHLORIDE 0.9 % IV BOLUS (SEPSIS)
1000.0000 mL | Freq: Once | INTRAVENOUS | Status: AC
  Administered 2014-03-27: 1000 mL via INTRAVENOUS

## 2014-03-27 MED ORDER — SODIUM CHLORIDE 0.9 % IJ SOLN
3.0000 mL | Freq: Two times a day (BID) | INTRAMUSCULAR | Status: DC
  Administered 2014-03-30: 3 mL via INTRAVENOUS

## 2014-03-27 NOTE — Progress Notes (Signed)
ANTIBIOTIC CONSULT NOTE - INITIAL  Pharmacy Consult for Zosyn Indication: sepsis  No Known Allergies  Patient Measurements: Height: 5\' 10"  (177.8 cm) Weight: 249 lb 1.6 oz (112.991 kg) IBW/kg (Calculated) : 68.5  Vital Signs: Temp: 97.1 F (36.2 C) (04/20 0534) Temp src: Oral (04/20 0534) BP: 110/60 mmHg (04/20 0534) Pulse Rate: 109 (04/20 0534) Intake/Output from previous day: 04/19 0701 - 04/20 0700 In: -  Out: 1000 [Stool:1000] Intake/Output from this shift: Total I/O In: 120 [P.O.:120] Out: -   Labs:  Recent Labs  03/26/14 2049 03/27/14 0631  WBC 20.5* 24.9*  HGB 17.0* 16.0*  PLT 279 244  CREATININE 0.91 1.30*   Estimated Creatinine Clearance: 48.6 ml/min (by C-G formula based on Cr of 1.3). No results found for this basename: VANCOTROUGH, Leodis BinetVANCOPEAK, VANCORANDOM, GENTTROUGH, GENTPEAK, GENTRANDOM, TOBRATROUGH, TOBRAPEAK, TOBRARND, AMIKACINPEAK, AMIKACINTROU, AMIKACIN,  in the last 72 hours   Microbiology: Recent Results (from the past 720 hour(s))  CLOSTRIDIUM DIFFICILE BY PCR     Status: None   Collection Time    03/26/14 10:10 PM      Result Value Ref Range Status   C difficile by pcr NEGATIVE  NEGATIVE Final  CULTURE, BLOOD (ROUTINE X 2)     Status: None   Collection Time    03/27/14  9:57 AM      Result Value Ref Range Status   Specimen Description Blood   Final   Special Requests NONE   Final   Culture NO GROWTH <24 HRS   Final   Report Status PENDING   Incomplete  CULTURE, BLOOD (ROUTINE X 2)     Status: None   Collection Time    03/27/14 10:03 AM      Result Value Ref Range Status   Specimen Description Blood   Final   Special Requests NONE   Final   Culture NO GROWTH <24 HRS   Final   Report Status PENDING   Incomplete   Medical History: Past Medical History  Diagnosis Date  . Hypertension   . Diabetes mellitus without complication    Medications:  Scheduled:  . antiseptic oral rinse  15 mL Mouth Rinse q12n4p  . chlorhexidine  15 mL  Mouth/Throat BID  . insulin aspart  0-20 Units Subcutaneous TID WC  . insulin aspart  0-5 Units Subcutaneous QHS  . piperacillin-tazobactam (ZOSYN)  IV  3.375 g Intravenous Q8H  . sodium chloride  3 mL Intravenous Q12H   Assessment: 80yo female admitted with acute encephalopathy and suspected sepsis.  Asked to initiate Zosyn per protocol.  Estimated Creatinine Clearance: 48.6 ml/min (by C-G formula based on Cr of 1.3).  Goal of Therapy:  Eradicate infection.  Plan:  Zosyn 3.375gm IV q8h, each dose over 4 hrs Monitor labs, renal fxn, and cultures  Diane Rangel A Leyani Gargus 03/27/2014,12:09 PM

## 2014-03-27 NOTE — Progress Notes (Signed)
UR completed 

## 2014-03-27 NOTE — Progress Notes (Signed)
INITIAL NUTRITION ASSESSMENT  DOCUMENTATION CODES Per approved criteria  -Obesity Unspecified   INTERVENTION: Follow for diet advancement and needs for nutritional supplement  NUTRITION DIAGNOSIS: Inadequate oral intake related to decreased responsiveness as evidenced by NPO.   Goal: Pt will meet > 90% of estimated nutritional needs  Monitor:  Diet advancement, PO intake, labs, skin assessments, weight changes, I/O's  Reason for Assessment: low braden=12  80 y.o. female  Admitting Dx: Sepsis  ASSESSMENT: Pt admitted from home with AMS. Due to pt somnolence, hx was obtained by pt son at bedside, who is very involved in her care. Son shares with this RD that he lives next door to pt and pt lives with his daughter and family.  Son reports pt was in good health PTA. He reports good appetite PTA on a regular diet. He denies that pt has ever had a swallow evaluation or that pt has had difficulty swallowing foods and liquids PTA. He reports that pt's status started declining around 1100 last night.  He reports very poor tolerance of breakfast today (PO: 105). Pt has been pocketing food in mouth. He reports he feels like she does better with liquids, but pt has since became NPO (previous diet order full liquid). Pt has been getting her mouth swabbed.  Pt son is unable to confirm or deny weight loss, however, he doubts any significant weight loss, as pt's nutrition status has been excellent up until today. Wt hx reveals UBW of 250#. Noted a 4# (1.6%) wt gain x 3 months, which is not clinically significant.  Nutrition Focused Physical Exam: limited due to pt somnolence  Subcutaneous Fat:  Orbital Region: WDL Upper Arm Region: unable to assess Thoracic and Lumbar Region: WDL  Muscle:  Temple Region: WDL Clavicle Bone Region: WDL Clavicle and Acromion Bone Region: WDL Scapular Bone Region: unable to assess Dorsal Hand: unable to asses Patellar Region: WDL Anterior Thigh Region: unable  to asses Posterior Calf Region: WDL  Edema: unable to assess   Height: Ht Readings from Last 1 Encounters:  03/27/14 5\' 10"  (1.778 m)    Weight: Wt Readings from Last 1 Encounters:  03/27/14 249 lb 1.6 oz (112.991 kg)    Ideal Body Weight: 160#  % Ideal Body Weight: 156%  Wt Readings from Last 10 Encounters:  03/27/14 249 lb 1.6 oz (112.991 kg)  12/19/12 245 lb (111.131 kg)  06/28/09 269 lb (122.018 kg)  03/29/09 250 lb 8 oz (113.626 kg)    Usual Body Weight: 250#  % Usual Body Weight: 100%  BMI:  Body mass index is 35.74 kg/(m^2). Meets criteria for obesity, class II.   Estimated Nutritional Needs: Kcal: 1600-1800 daily Protein: 90-113 grams daily Fluid: 1.6-1.8 L daily  Skin: Intact  Diet Order: NPO  EDUCATION NEEDS: -Education needs addressed   Intake/Output Summary (Last 24 hours) at 03/27/14 1203 Last data filed at 03/27/14 0800  Gross per 24 hour  Intake    120 ml  Output   1000 ml  Net   -880 ml    Last BM: 03/27/14  Labs:   Recent Labs Lab 03/26/14 2049 03/27/14 0631  NA 138 139  K 4.0 4.7  CL 96 98  CO2 21 22  BUN 24* 32*  CREATININE 0.91 1.30*  CALCIUM 9.6 8.7  GLUCOSE 264* 304*    CBG (last 3)   Recent Labs  03/27/14 1130  GLUCAP 341*    Scheduled Meds: . antiseptic oral rinse  15 mL Mouth Rinse q12n4p  .  chlorhexidine  15 mL Mouth/Throat BID  . insulin aspart  0-20 Units Subcutaneous TID WC  . insulin aspart  0-5 Units Subcutaneous QHS  . piperacillin-tazobactam (ZOSYN)  IV  3.375 g Intravenous Q8H  . sodium chloride  3 mL Intravenous Q12H    Continuous Infusions: . sodium chloride 100 mL/hr at 03/27/14 0845    Past Medical History  Diagnosis Date  . Hypertension   . Diabetes mellitus without complication     Past Surgical History  Procedure Laterality Date  . Ankle surgery      Srihitha Tagliaferri A. Mayford KnifeWilliams, RD, LDN Pager: [phone]

## 2014-03-27 NOTE — Progress Notes (Signed)
PROGRESS NOTE  Diane Rangel P Riebel MWU:[id number] DOB: May 21, 1934 DOA: 03/26/2014 PCP: Ernestine ConradBLUTH, KIRK, MD  Interim summary 80 year old female with a history of diabetes mellitus, hypertension, and irritable bowel syndrome presented with mental status change and diarrhea. The patient is unable to provide any history due to her acute encephalopathy. All the history is obtained from speaking with the patient's son at the bedside. The patient was in her usual state of health up until 6 PM on 03/26/2014. The patient had an acute onset of diarrhea without hematochezia or melena. The patient became weak after her for loose bowel movement, and her family later today around 6:30 PM. When the family came back approximately 10-15 minutes later, the patient was minimally responsive and not speaking. As a result, EMS was activated. In the ED, the patient had an episode of emesis, but the patient's son stated that she was able to communicate. There's been no recent travels, eating raw or undercooked foods, or sick contacts. The patient lives with her granddaughter and everyone else in the household is without any diarrheal illness. On the morning of 03/27/2014, the patient became more encephalopathic. She was awake and occasionally follows one-step commands, but not answering questions appropriately.  Assessment/Plan: Acute encephalopathy -Suspect toxic metabolic etiology -Also concerned about new stroke -patient was unable to move her right upper extremity which is a new finding -Examination also revealed some right facial droop Sepsis -Present at the time of admission -Patient had tachycardia with leukocytosis with evidence of UTI Right hemiparesis/ Dysphasia -MRI brain -MRA brain -Echocardiogram -Carotid Doppler -PT/ST Leukocytosis -Concerned about aspiration as well as intra-abdominal process causing the patient's diarrhea -Also partly due to UTI--followup urine culture -Blood cultures x2  sets -Broaden antibiotic spectrum -The patient had retained food in her mouth on examination-->make NPO -CT abdomen pelvis Diarrhea -Clostridium difficile PCR negative -GI pathogen panel pending -CT abdomen pelvis without contrast Diabetes mellitus -Hemoglobin A1c -NovoLog sliding scale Hypertension -Discontinue amlodipine, HCTZ, lisinopril as blood pressure is soft AKI -Likely due to volume depletion -Start IV fluids  Family Communication:   son at beside updated.  Total time 60 min, >50% spent counseling family and coordinating care Disposition Plan:   Home when medically stable   Antibiotics:  Zosyn 03/28/14>>>    Procedures/Studies: Ct Head Wo Contrast  03/26/2014   CLINICAL DATA:  Drug overdose.  Altered mental status.  EXAM: CT HEAD WITHOUT CONTRAST  TECHNIQUE: Contiguous axial images were obtained from the base of the skull through the vertex without intravenous contrast.  COMPARISON:  CT HEAD W/O CM dated 06/24/2004  FINDINGS: Chronic ischemic changes in the periventricular white matter and left caudate head. No mass effect, midline shift, or acute intracranial hemorrhage. Mastoid air cells are clear.  IMPRESSION: No acute intracranial pathology.   Electronically Signed   By: Maryclare BeanArt  Hoss M.D.   On: 03/26/2014 23:45   Dg Chest Port 1 View  03/26/2014   CLINICAL DATA:  Shortness of Breath  EXAM: PORTABLE CHEST - 1 VIEW  COMPARISON:  December 19, 2012  FINDINGS: There is no edema or consolidation. Heart is mildly enlarged with normal pulmonary vascularity. No adenopathy. There is degenerative change in the thoracic spine.  IMPRESSION: Mild cardiac enlargement.  No edema or consolidation.   Electronically Signed   By: Bretta BangWilliam  Woodruff M.D.   On: 03/26/2014 21:18   Dg Abd 2 Views  03/26/2014   CLINICAL DATA:  Diarrhea  EXAM: ABDOMEN - 2  VIEW  COMPARISON:  None.  FINDINGS: Study is limited due to body habitus. Gaseous distention of colon and possibly small bowel is present. Left  pelvic phlebolith. No obvious free intraperitoneal gas.  IMPRESSION: Limited exam.  Distended loops of colon are nonspecific.   Electronically Signed   By: Maryclare Bean M.D.   On: 03/26/2014 23:29         Subjective: Patient is encephalopathic. She opens her eyes and is awake. She intermittently follows commands but is not answering questions appropriately. The events overnight reviewed. No vomiting but continues to have diarrhea. No respiratory distress.   Objective: Filed Vitals:   03/27/14 0008 03/27/14 0009 03/27/14 0058 03/27/14 0534  BP: 128/79 137/77 138/68 110/60  Pulse: 111 112 102 109  Temp:   97.8 F (36.6 C) 97.1 F (36.2 C)  TempSrc:   Oral Oral  Resp:  22 22 22   Height:   5\' 10"  (1.778 m)   Weight:   112.991 kg (249 lb 1.6 oz)   SpO2:  98% 97% 97%    Intake/Output Summary (Last 24 hours) at 03/27/14 1109 Last data filed at 03/27/14 0800  Gross per 24 hour  Intake    120 ml  Output   1000 ml  Net   -880 ml   Weight change:  Exam:   General:  Pt is alert, follows commands appropriately, not in acute distress  HEENT: No icterus, No thrush, Reader/AT; retained food in her mouth; no meningismus   Cardiovascular: RRR, S1/S2, no rubs, no gallops  Respiratory: Scattered bilateral rales  Abdomen: Soft/+BS, non tender, non distended, no guarding  Extremities: trace LE edema, No lymphangitis, No petechiae, No rashes, no synovitis  Data Reviewed: Basic Metabolic Panel:  Recent Labs Lab 03/26/14 2049 03/27/14 0631  NA 138 139  K 4.0 4.7  CL 96 98  CO2 21 22  GLUCOSE 264* 304*  BUN 24* 32*  CREATININE 0.91 1.30*  CALCIUM 9.6 8.7   Liver Function Tests:  Recent Labs Lab 03/26/14 2049  AST 34  ALT 19  ALKPHOS 83  BILITOT 0.7  PROT 7.5  ALBUMIN 3.5    Recent Labs Lab 03/26/14 2049  LIPASE 45   No results found for this basename: AMMONIA,  in the last 168 hours CBC:  Recent Labs Lab 03/26/14 2049 03/27/14 0631  WBC 20.5* 24.9*  NEUTROABS  17.3*  --   HGB 17.0* 16.0*  HCT 49.5* 47.1*  MCV 88.1 88.9  PLT 279 244   Cardiac Enzymes:  Recent Labs Lab 03/26/14 2049  TROPONINI <0.30   BNP: No components found with this basename: POCBNP,  CBG: No results found for this basename: GLUCAP,  in the last 168 hours  Recent Results (from the past 240 hour(s))  CLOSTRIDIUM DIFFICILE BY PCR     Status: None   Collection Time    03/26/14 10:10 PM      Result Value Ref Range Status   C difficile by pcr NEGATIVE  NEGATIVE Final  CULTURE, BLOOD (ROUTINE X 2)     Status: None   Collection Time    03/27/14  9:57 AM      Result Value Ref Range Status   Specimen Description Blood   Final   Special Requests NONE   Final   Culture NO GROWTH <24 HRS   Final   Report Status PENDING   Incomplete  CULTURE, BLOOD (ROUTINE X 2)     Status: None   Collection Time  03/27/14 10:03 AM      Result Value Ref Range Status   Specimen Description Blood   Final   Special Requests NONE   Final   Culture NO GROWTH <24 HRS   Final   Report Status PENDING   Incomplete     Scheduled Meds: . insulin aspart  0-20 Units Subcutaneous TID WC  . insulin aspart  0-5 Units Subcutaneous QHS  . piperacillin-tazobactam (ZOSYN)  IV  3.375 g Intravenous Q8H  . sodium chloride  3 mL Intravenous Q12H   Continuous Infusions: . sodium chloride 100 mL/hr at 03/27/14 0845     Catarina Hartshornavid Elease Swarm, DO  [facility] Pager [phone]  If 7PM-7AM, please contact night-coverage [email or web address] Password [email or web address] 03/27/2014, 11:09 AM   LOS: 1 day

## 2014-03-27 NOTE — Care Management Note (Signed)
    Page 1 of 1   03/27/2014     3:03:06 PM CARE MANAGEMENT NOTE 03/27/2014  Patient:  Diane Rangel,Diane Rangel   Account Number:  [id number]  Date Initiated:  03/27/2014  Documentation initiated by:  Rosemary HolmsOBSON,Arye Weyenberg  Subjective/Objective Assessment:   pt lives with g'daughter at home. CM to follow for progression and DC needs.     Action/Plan:   Anticipated DC Date:     Anticipated DC Plan:        DC Planning Services  CM consult      Choice offered to / List presented to:             Status of service:  In process, will continue to follow Medicare Important Message given?   (If response is "NO", the following Medicare IM given date fields will be blank) Date Medicare IM given:   Date Additional Medicare IM given:    Discharge Disposition:    Per UR Regulation:    If discussed at Long Length of Stay Meetings, dates discussed:    Comments:  03/27/14 Rosemary HolmsAmy Angelito Hopping RN BSN CM

## 2014-03-27 NOTE — Progress Notes (Signed)
Echocardiogram 2D Echocardiogram limited has been performed.  Diane Rangel Diane Rangel 03/27/2014, 11:48 AM

## 2014-03-27 NOTE — Progress Notes (Signed)
SLP Cancellation Note  Patient Details Name: Dellie Catholicnez P Rossman MRN: [id number] DOB: 1934/04/08   Cancelled treatment:  ST received order for BSE.  Evaluation deferred to 03/28/14.  Confirmed with RN.  Moreen FowlerKaren Gola Bribiesca MS, CCC-SLP [phone] Lacinda AxonKaren H Issaiah Seabrooks 03/27/2014, 4:19 PM

## 2014-03-27 NOTE — ED Notes (Signed)
Patient unable to stand at this time for orthostatic

## 2014-03-27 NOTE — Progress Notes (Signed)
[facility] PROGRESS NOTE  Diane Rangel [id number] DOB: [birth year] DOA: 03/26/2014 PCP: Ernestine ConradBLUTH, KIRK, MD   Summary: 80 year old with persistent diarrhea which led to syncopal event at home. Admitted with acute gastroenteritis and UTI. Developed acute renal failure and acute encephalopathy.   Assessment/Plan: Principal Problem: Sepsis: likely related to UTI. Elevated lactic acid on admission, leukocytosis, tachycardia. She is currently afebrile and hemodynamically stable. Will continue supportive therapy with IV fluids. Rocephin day #2. Will add Zosyn. Will monitor closely Active Problems: Gastroenteritis: cdiff negative. GI pathogen panel pending. Lipase within normal limits.  Abdominal xray reveals  Gaseous distention of colon and possibly small bowel is present. Left pelvic phlebolith. No obvious free intraperitoneal gas. Continues with persistent diarrhea. Will obtain CT abdomen/pelvis.  Will discontinue home amitizia.  Will continue antibiotics as above.   UTI (lower urinary tract infection) urine culture pending. Afebrile. White count trending upward. Antibiotics as above.     Acute renal failure: likely related to dehydration as result of #2. Continue IV fluids. Hold any nephrotoxins. Recheck in am.    Acute encephalopathy: concern for stroke. Lethargic and slow to respond. Non-verbal but will smile.  CT without acute abnormalities. Will obtain MRI/MRA brain.  Continue IV fluids and antibiotics as above. If no improvement consider MRI  Syncope, vasovagal: likely related to volume given persistent diarrhea. Will check orthostatics when able. 2decho pending. tropnin negative. ProBNP 231.     DIABETES MELLITUS, TYPE II, UNCONTROLLED: cbg range 264-304. Will obtain hgA1c. Use SSI for optimal glycemic control.              Code Status: full Family Communication: son at bedside Disposition Plan: home when  ready   Consultants:  none  Procedures:  none  Antibiotics:  Rocephin 03/26/14>>>  Zosyn 03/27/14>>  HPI/Subjective: Lying in bed eyes open. Lethargic. Smiles. Slow to follow commands  Objective: Filed Vitals:   03/27/14 0534  BP: 110/60  Pulse: 109  Temp: 97.1 F (36.2 C)  Resp: 22    Intake/Output Summary (Last 24 hours) at 03/27/14 0921 Last data filed at 03/26/14 2227  Gross per 24 hour  Intake      0 ml  Output   1000 ml  Net  -1000 ml   Filed Weights   03/27/14 0058  Weight: 112.991 kg (249 lb 1.6 oz)    Exam:   General:  Obese lethargic NAD  Cardiovascular: RRR No MGR No LE edema  Respiratory: normal effort BS somewhat distant. No wheeze no rhonchi  Abdomen: obese soft sluggish BS No guarding non-tender  Musculoskeletal: no clubbing or cyanosis   Neuro: non-verbal. ?facial droop. Not moving right arm.   Data Reviewed: Basic Metabolic Panel:  Recent Labs Lab 03/26/14 2049 03/27/14 0631  NA 138 139  K 4.0 4.7  CL 96 98  CO2 21 22  GLUCOSE 264* 304*  BUN 24* 32*  CREATININE 0.91 1.30*  CALCIUM 9.6 8.7   Liver Function Tests:  Recent Labs Lab 03/26/14 2049  AST 34  ALT 19  ALKPHOS 83  BILITOT 0.7  PROT 7.5  ALBUMIN 3.5    Recent Labs Lab 03/26/14 2049  LIPASE 45   No results found for this basename: AMMONIA,  in the last 168 hours CBC:  Recent Labs Lab 03/26/14 2049 03/27/14 0631  WBC 20.5* 24.9*  NEUTROABS 17.3*  --   HGB 17.0* 16.0*  HCT 49.5* 47.1*  MCV 88.1 88.9  PLT 279 244   Cardiac Enzymes:  Recent  Labs Lab 03/26/14 2049  TROPONINI <0.30   BNP (last 3 results)  Recent Labs  03/26/14 2049  PROBNP 231.2   CBG: No results found for this basename: GLUCAP,  in the last 168 hours  Recent Results (from the past 240 hour(s))  CLOSTRIDIUM DIFFICILE BY PCR     Status: None   Collection Time    03/26/14 10:10 PM      Result Value Ref Range Status   C difficile by pcr NEGATIVE  NEGATIVE Final      Studies: Ct Head Wo Contrast  03/26/2014   CLINICAL DATA:  Drug overdose.  Altered mental status.  EXAM: CT HEAD WITHOUT CONTRAST  TECHNIQUE: Contiguous axial images were obtained from the base of the skull through the vertex without intravenous contrast.  COMPARISON:  CT HEAD W/O CM dated 06/24/2004  FINDINGS: Chronic ischemic changes in the periventricular white matter and left caudate head. No mass effect, midline shift, or acute intracranial hemorrhage. Mastoid air cells are clear.  IMPRESSION: No acute intracranial pathology.   Electronically Signed   By: Maryclare BeanArt  Hoss M.D.   On: 03/26/2014 23:45   Dg Chest Port 1 View  03/26/2014   CLINICAL DATA:  Shortness of Breath  EXAM: PORTABLE CHEST - 1 VIEW  COMPARISON:  December 19, 2012  FINDINGS: There is no edema or consolidation. Heart is mildly enlarged with normal pulmonary vascularity. No adenopathy. There is degenerative change in the thoracic spine.  IMPRESSION: Mild cardiac enlargement.  No edema or consolidation.   Electronically Signed   By: Bretta BangWilliam  Woodruff M.D.   On: 03/26/2014 21:18   Dg Abd 2 Views  03/26/2014   CLINICAL DATA:  Diarrhea  EXAM: ABDOMEN - 2 VIEW  COMPARISON:  None.  FINDINGS: Study is limited due to body habitus. Gaseous distention of colon and possibly small bowel is present. Left pelvic phlebolith. No obvious free intraperitoneal gas.  IMPRESSION: Limited exam.  Distended loops of colon are nonspecific.   Electronically Signed   By: Maryclare BeanArt  Hoss M.D.   On: 03/26/2014 23:29    Scheduled Meds: . cefTRIAXone (ROCEPHIN)  IV  1 g Intravenous QHS  . lubiprostone  8 mcg Oral BID  . piperacillin-tazobactam (ZOSYN)  IV  3.375 g Intravenous Q8H  . sodium chloride  3 mL Intravenous Q12H   Continuous Infusions: . sodium chloride 100 mL/hr at 03/27/14 0845    Principal Problem:   Sepsis Active Problems:   DIABETES MELLITUS, TYPE II, UNCONTROLLED   Gastroenteritis, acute   UTI (lower urinary tract infection)   Syncope,  vasovagal   Gastroenteritis   Acute renal failure   Acute encephalopathy  Patient seen and examined. Case was discussed with Toya SmothersKaren Black, NP.  Agree with assessment and plan. Addendum to note was added.  Time spent: 45 minutes    Lesle ChrisKaren M Lac/[facility]anch[facility] Rehab CenterBlack  [facility] Pager [phone]. If 7PM-7AM, please contact night-coverage at [email or web address], password [email or web address] 03/27/2014, 9:21 AM  LOS: 1 day

## 2014-03-28 ENCOUNTER — Encounter (HOSPITAL_COMMUNITY): Payer: Self-pay | Admitting: Internal Medicine

## 2014-03-28 ENCOUNTER — Inpatient Hospital Stay (HOSPITAL_COMMUNITY): Payer: Medicare Other

## 2014-03-28 DIAGNOSIS — I6350 Cerebral infarction due to unspecified occlusion or stenosis of unspecified cerebral artery: Secondary | ICD-10-CM

## 2014-03-28 LAB — BASIC METABOLIC PANEL
BUN: 58 mg/dL — ABNORMAL HIGH (ref 6–23)
CALCIUM: 8.5 mg/dL (ref 8.4–10.5)
CO2: 22 meq/L (ref 19–32)
CREATININE: 2.55 mg/dL — AB (ref 0.50–1.10)
Chloride: 104 mEq/L (ref 96–112)
GFR calc Af Amer: 20 mL/min — ABNORMAL LOW (ref >90)
GFR, EST NON AFRICAN AMERICAN: 17 mL/min — AB (ref >90)
GLUCOSE: 230 mg/dL — AB (ref 70–99)
Potassium: 4.7 mEq/L (ref 3.7–5.3)
Sodium: 143 mEq/L (ref 137–147)

## 2014-03-28 LAB — URINE CULTURE: Colony Count: >=100000

## 2014-03-28 LAB — CBC
HCT: 41.4 % (ref 36.0–46.0)
HEMOGLOBIN: 14 g/dL (ref 12.0–15.0)
MCH: 29.8 pg (ref 26.0–34.0)
MCHC: 33.8 g/dL (ref 30.0–36.0)
MCV: 88.1 fL (ref 78.0–100.0)
PLATELETS: 216 10*3/uL (ref 150–400)
RBC: 4.7 MIL/uL (ref 3.87–5.11)
RDW: 14.6 % (ref 11.5–15.5)
WBC: 21.1 10*3/uL — ABNORMAL HIGH (ref 4.0–10.5)

## 2014-03-28 LAB — GLUCOSE, CAPILLARY
Glucose-Capillary: 216 mg/dL — ABNORMAL HIGH (ref 70–99)
Glucose-Capillary: 156 mg/dL — ABNORMAL HIGH (ref 70–99)
Glucose-Capillary: 155 mg/dL — ABNORMAL HIGH (ref 70–99)
Glucose-Capillary: 127 mg/dL — ABNORMAL HIGH (ref 70–99)

## 2014-03-28 MED ORDER — INSULIN ASPART 100 UNIT/ML ~~LOC~~ SOLN: 0.0000 [IU] | Freq: Every day | SUBCUTANEOUS | Status: DC

## 2014-03-28 MED ORDER — INSULIN DETEMIR 100 UNIT/ML ~~LOC~~ SOLN
20.0000 [IU] | Freq: Every day | SUBCUTANEOUS | Status: DC
  Administered 2014-03-28: 20 [IU] via SUBCUTANEOUS
  Filled 2014-03-28 (×2): qty 0.2

## 2014-03-28 MED ORDER — SODIUM CHLORIDE 0.9 % IV BOLUS (SEPSIS)
500.0000 mL | Freq: Once | INTRAVENOUS | Status: AC
  Administered 2014-03-28: 500 mL via INTRAVENOUS

## 2014-03-28 MED ORDER — SODIUM CHLORIDE 0.9 % IV SOLN
2000.0000 mg | Freq: Once | INTRAVENOUS | Status: AC
  Administered 2014-03-28: 2000 mg via INTRAVENOUS
  Filled 2014-03-28: qty 2000

## 2014-03-28 MED ORDER — INSULIN ASPART 100 UNIT/ML ~~LOC~~ SOLN
0.0000 [IU] | Freq: Three times a day (TID) | SUBCUTANEOUS | Status: DC
  Administered 2014-03-28 (×2): 3 [IU] via SUBCUTANEOUS
  Administered 2014-03-30: 11 [IU] via SUBCUTANEOUS
  Administered 2014-03-30: 3 [IU] via SUBCUTANEOUS
  Administered 2014-03-30: 2 [IU] via SUBCUTANEOUS
  Administered 2014-03-31: 5 [IU] via SUBCUTANEOUS
  Administered 2014-03-31: 3 [IU] via SUBCUTANEOUS

## 2014-03-28 MED ORDER — VANCOMYCIN HCL 10 G IV SOLR
1500.0000 mg | INTRAVENOUS | Status: DC
  Administered 2014-03-30: 1500 mg via INTRAVENOUS
  Filled 2014-03-28: qty 1500

## 2014-03-28 NOTE — Progress Notes (Signed)
Inpatient Diabetes Program Recommendations  AACE/ADA: New Consensus Statement on Inpatient Glycemic Control (2013)  Target Ranges:  Prepandial:   less than 140 mg/dL      Peak postprandial:   less than 180 mg/dL (1-2 hours)      Critically ill patients:  140 - 180 mg/dL   Results for Dellie CatholicJOINES, Elisea P (MRN [id number]) as of 03/28/2014 08:18  Ref. Range 03/27/2014 11:30 03/27/2014 16:17 03/27/2014 21:45 03/28/2014 07:30  Glucose-Capillary Latest Range: 70-99 mg/dL 409341 (H) 811282 (H) 914238 (H) 216 (H)   Diabetes history: DM2 Outpatient Diabetes medications: Novolin N 30 units BID (according to note on home med list, patient's granddaughter states patient takes less insulin than prescribed). Current orders for Inpatient glycemic control: Novolog 0-20 units AC, Novolog 0-5 units HS  Inpatient Diabetes Program Recommendations Insulin - Basal: Please consider ordering Levemir 20 units daily. Correction (SSI): If patient will remain NPO, please consider changing Novolog correction frequency to Q4H and may want to consider decreasing scale to moderate scale since renal function is worse today.  Thanks, Orlando PennerMarie Kaleo Condrey, RN, MSN, CCRN Diabetes Coordinator Inpatient Diabetes Program [phone] (Team Pager) [phone] ([facility] office) [phone] [facility] office)

## 2014-03-28 NOTE — Evaluation (Signed)
Clinical/Bedside Swallow Evaluation Patient Details  Name: Dellie Catholicnez P Ayars MRN: [id number] Date of Birth: January 08, 1934  Today's Date: 03/28/2014 Time: 1015-1030 SLP Time Calculation (min): 15 min  Past Medical History:  Past Medical History  Diagnosis Date  . Hypertension   . Diabetes mellitus without complication   . CVA (cerebral infarction)    Past Surgical History:  Past Surgical History  Procedure Laterality Date  . Ankle surgery     HPI:  80 year old female with a history of diabetes mellitus, hypertension, and irritable bowel syndrome presented with mental status change and diarrhea.  The patient was in her usual state of health up until 6 PM on 03/26/2014. The patient had an acute onset of diarrhea without hematochezia or melena. The patient became weak after her for loose bowel movement, and her family later today around 6:30 PM. When the family came back approximately 10-15 minutes later, the patient was minimally responsive and not speaking. As a result, EMS was activated. In the ED, the patient had an episode of emesis, but the patient's son stated that she was able to communicate. The patient lives with her granddaughter and everyone else in the household is without any diarrheal illness. On the morning of 03/27/2014, the patient became more encephalopathic. MRI: Acute moderate to large size medial left frontal lobe nonhemorrhagic infarct extending into the parietal lobe in a left anterior cerebral artery distribution.     Assessment / Plan / Recommendation Clinical Impression  BSE completed per Stroke Protocol but limited.    Administration of PO's contraindicated secondary to patient presenting with lethargy with inability to maintain LOA  longer than brief moments.  Oral care completed.  Patient cognitively unable to complete volitional swallow/throat clear indicating decreased ability to protect airway with PO's at this time.  Recommend continued NPO status due to high risk  for aspiration mainly due to decreased LOA and cognitive status.  ST to f/u on 03/29/14 to determine PO readiness vs. readiness to complete objective evaluation of MBS.  Recommend completion of oral care QID.      Aspiration Risk  Moderate    Diet Recommendation NPO;Alternative means - temporary   Medication Administration: Via alternative means    Other  Recommendations Oral Care Recommendations: Oral care Q4 per protocol   Follow Up Recommendations  Inpatient Rehab    Frequency and Duration min 2x/week  2 weeks       SLP Swallow Goals Please refer to Care Plans for listed goals   Swallow Study Prior Functional Status   Lived at home.  No prior reports of dysphagia     General Date of Onset: 03/26/14 HPI: 80 year old female with a history of diabetes mellitus, hypertension, and irritable bowel syndrome presented with mental status change and diarrhea. The patient is unable to provide any history due to her acute encephalopathy. All the history is obtained from speaking with the patient's son at the bedside. The patient was in her usual state of health up until 6 PM on 03/26/2014. The patient had an acute onset of diarrhea without hematochezia or melena. The patient became weak after her for loose bowel movement, and her family later today around 6:30 PM. When the family came back approximately 10-15 minutes later, the patient was minimally responsive and not speaking. As a result, EMS was activated. In the ED, the patient had an episode of emesis, but the patient's son stated that she was able to communicate. There's been no recent travels, eating raw or undercooked foods,  or sick contacts. The patient lives with her granddaughter and everyone else in the household is without any diarrheal illness. On the morning of 03/27/2014, the patient became more encephalopathic. MRI/MRA reveals acute cerebral infarct. Type of Study: Bedside swallow evaluation Previous Swallow Assessment: N/A Diet  Prior to this Study: NPO Respiratory Status: Nasal cannula History of Recent Intubation: No Behavior/Cognition: Lethargic;Requires cueing;Doesn't follow directions Oral Cavity - Dentition: Missing dentition;Poor condition Patient Positioning: Upright in bed Baseline Vocal Quality: Other (comment) Volitional Cough: Cognitively unable to elicit Volitional Swallow: Unable to elicit    Oral/Motor/Sensory Function Overall Oral Motor/Sensory Function: Impaired Labial ROM: Reduced right Labial Symmetry: Abnormal symmetry right Labial Strength: Reduced Labial Sensation: Reduced Lingual ROM: Reduced right Lingual Symmetry: Abnormal symmetry right Lingual Strength: Reduced Lingual Sensation: Reduced Facial ROM: Reduced right Facial Symmetry: Right droop Facial Strength: Reduced Facial Sensation: Reduced Velum: Within Functional Limits Mandible: Within Functional Limits   Ice Chips Ice chips: Not tested   Thin Liquid Thin Liquid: Not tested    Nectar Thick Nectar Thick Liquid: Not tested   Honey Thick Honey Thick Liquid: Not tested   Puree Puree: Not tested   Solid   GO    Solid: Not tested      Moreen FowlerKaren Braeden Kennan MS, CCC-SLP [phone] Neldon LabellaKaren H Coalton Arch 03/28/2014,10:41 AM

## 2014-03-28 NOTE — Progress Notes (Signed)
PT Cancellation Note  Patient Details Name: Dellie Catholicnez P Marcell MRN: [id number] DOB: 07-21-1934   Cancelled Treatment:    Reason Eval/Treat Not Completed: Patient's level of consciousness   Konrad PentaClaudia L Zayon Trulson 03/28/2014, 12:41 PM

## 2014-03-28 NOTE — Progress Notes (Signed)
ANTIBIOTIC CONSULT NOTE - INITIAL  Pharmacy Consult for Zosyn and Vancomycin Indication: sepsis  No Known Allergies  Patient Measurements: Height: 5\' 10"  (177.8 cm) Weight: 249 lb 1.6 oz (112.991 kg) IBW/kg (Calculated) : 68.5  Vital Signs: Temp: 99 F (37.2 C) (04/21 1030) Temp src: Axillary (04/21 1030) BP: 108/66 mmHg (04/21 1030) Pulse Rate: 93 (04/21 1030) Intake/Output from previous day: 04/20 0701 - 04/21 0700 In: 1158.3 [P.O.:120; I.V.:988.3; IV Piggyback:50] Out: 502 [Urine:500; Stool:2] Intake/Output from this shift:    Labs:  Recent Labs  03/26/14 2049 03/27/14 0631 03/28/14 0547  WBC 20.5* 24.9* 21.1*  HGB 17.0* 16.0* 14.0  PLT 279 244 216  CREATININE 0.91 1.30* 2.55*   Estimated Creatinine Clearance: 24.8 ml/min (by C-G formula based on Cr of 2.55). No results found for this basename: VANCOTROUGH, Leodis BinetVANCOPEAK, VANCORANDOM, GENTTROUGH, GENTPEAK, GENTRANDOM, TOBRATROUGH, TOBRAPEAK, TOBRARND, AMIKACINPEAK, AMIKACINTROU, AMIKACIN,  in the last 72 hours   Microbiology: Recent Results (from the past 720 hour(s))  URINE CULTURE     Status: None   Collection Time    03/26/14  9:57 PM      Result Value Ref Range Status   Specimen Description URINE, CATHETERIZED   Final   Special Requests NONE   Final   Culture  Setup Time     Final   Value: 03/27/2014 02:30     [facility]   Colony Count     Final   Value: >=100,000 COLONIES/ML     [facility]   Culture     Final   Value: GRAM NEGATIVE RODS     [facility]formed at [facility]   Report Status PENDING   Incomplete  CLOSTRIDIUM DIFFICILE BY PCR     Status: None   Collection Time    03/26/14 10:10 PM      Result Value Ref Range Status   C difficile by pcr NEGATIVE  NEGATIVE Final  CULTURE, BLOOD (ROUTINE X 2)     Status: None   Collection Time    03/27/14  9:57 AM      Result Value Ref Range Status   Specimen Description BLOOD RIGHT ANTECUBITAL   Final   Special Requests     Final   Value: BOTTLES DRAWN AEROBIC AND ANAEROBIC AEB=9CC ANA=11CC   Culture NO GROWTH 1 DAY   Final   Report Status PENDING   Incomplete  CULTURE, BLOOD (ROUTINE X 2)     Status: None   Collection Time    03/27/14 10:03 AM      Result Value Ref Range Status   Specimen Description BLOOD LEFT HAND   Final   Special Requests BOTTLES DRAWN AEROBIC AND ANAEROBIC 10CC   Final   Culture NO GROWTH 1 DAY   Final   Report Status PENDING   Incomplete   Medical History: Past Medical History  Diagnosis Date  . Hypertension   . Diabetes mellitus without complication   . CVA (cerebral infarction)    Medications:  Scheduled:  . acetaminophen  1,000 mg Intravenous 4 times per day  . antiseptic oral rinse  15 mL Mouth Rinse q12n4p  . aspirin  300 mg Rectal Daily  . chlorhexidine  15 mL Mouth/Throat BID  . insulin aspart  0-15 Units Subcutaneous TID WC  . insulin detemir  20 Units Subcutaneous Daily  . piperacillin-tazobactam (ZOSYN)  IV  3.375 g Intravenous Q8H  . sodium chloride  500 mL Intravenous Once  . sodium  chloride  3 mL Intravenous Q12H  . [START ON 03/30/2014] vancomycin  1,500 mg Intravenous Q48H  . vancomycin  2,000 mg Intravenous Once   Assessment: 80yo obese female admitted with acute encephalopathy and suspected sepsis.  Asked to initiate Zosyn per protocol on 4/20. Vancomycin added 4/21.  SCr is worse today.   Estimated Creatinine Clearance: 24.8 ml/min (by C-G formula based on Cr of 2.55).  Zosyn 4/20 >> Vancomycin 4/21 >>  Goal of Therapy:  Eradicate infection. Vancomycin trough level 15-20  Plan:   Zosyn 3.375gm IV q8h, each dose over 4 hrs  Vancomycin 2000mg  IV today x 1 dose (loading dose) then  Vancomycin 1500mg  IV q48hrs  Check trough at steady state  Monitor labs, renal fxn, and cultures  Cortlan Dolin A Naheim Burgen 03/28/2014,11:11 AM

## 2014-03-28 NOTE — Clinical Social Work Note (Signed)
CSW noted consult, awaiting results of PT evaluation to guide discharge planning.  Patient unable to communicate/participate in assessment at present due to diminished level of consciousness.  CSW will await guidance from medical team to determine appropriate involvement of social work.  Diane GeneraAnne Haroldine Redler, LCSW Clinical Social Worker [phone])

## 2014-03-28 NOTE — Progress Notes (Signed)
[facility] PROGRESS NOTE  Diane Rangel UJW:[id number] DOB: [birth year] DOA: 03/26/2014 PCP: Ernestine Conrad, MD   Summary: 80 year old female with a history of diabetes mellitus, hypertension, and irritable bowel syndrome presented with mental status change and diarrhea. The patient is unable to provide any history due to her acute encephalopathy. All the history is obtained from speaking with the patient's son at the bedside. The patient was in her usual state of health up until 6 PM on 03/26/2014. The patient had an acute onset of diarrhea without hematochezia or melena. The patient became weak after her for loose bowel movement, and her family later today around 6:30 PM. When the family came back approximately 10-15 minutes later, the patient was minimally responsive and not speaking. As a result, EMS was activated. In the ED, the patient had an episode of emesis, but the patient's son stated that she was able to communicate. There's been no recent travels, eating raw or undercooked foods, or sick contacts. The patient lives with her granddaughter and everyone else in the household is without any diarrheal illness. On the morning of 03/27/2014, the patient became more encephalopathic. MRI/MRA reveals acute cerebral infarct.   Assessment/Plan: Acute cerebral infarct  -MRI/MRA confirmed.   - Bilateral carotid bifurcation and proximal ICA plaque, resulting  in less than 50% diameter stenosis per carotid doppler  - 2 decho with very poor acoustic windows limit study. Cannot fully evaluate RV systolic function. LV systolic function (based on parasternal views and apical 4 chamber view) is normal. -lipids with HDL 33 otherwise ENL, HgA1c 6.8. -evaluated by neurology who opine stroke workup unrevealing -continue asa and statin Sepsis  -in setting of likely dehydration from GI losess -temp spike 99.5 axillary. IV tylenol given. Await blood cultures -urine culture with gram negative rods.   -atelectasis vs infiltrate bases of lungs r>L on CT. Concern for aspiration -zosyn day #2, rocephin 03/26/14-4/20; will add vancomycin -remains tachycardia with leukocytosis trending downward.  - UTI ; will obtain urine esosinophils -will give bolus NS and increase fluid to 150/hr.  Right hemiparesis/ Dysphasia  -MRI brain  -MRA brain  -Echocardiogram  -Carotid Doppler  -PT/ST  -see above Leukocytosis  -Concerned about aspiration as well as intra-abdominal process causing the patient's diarrhea  -Also partly due to UTI--urine culture with gram neg rod -Blood cultures no growth to date  -Broaden antibiotic spectrum  - NPO  -CT abdomen pelvis with mild proctitis and atelectatsis vs infiltrate lung bases Diarrhea  -Clostridium difficile PCR negative  -GI pathogen panel negative  -CT abdomen pelvis as above -somewhat diminished  Diabetes mellitus  -Hemoglobin A1c 6.8 -NovoLog sliding scale  Hypertension  -Discontinue amlodipine, HCTZ, lisinopril as blood pressure is soft  AKI  -worsening this am.  -Likely due to volume depletion  -Increase IV fluids as above -obtain renal US eval for obstruction   Code Status:full Family Communication: son at bedside Disposition Plan: unclear   Consultants:  neurology  Procedures: none  Antibiotics:  Rocephin 03/26/14-03/27/14  Zosyn 03/27/14>>  Vancomycin 03/28/14>>  HPI/Subjective: Lying in bed eyes closed. Responds to only painful stimuli   Objective: Filed Vitals:   03/28/14 0525  BP: 121/50  Pulse: 102  Temp: 97.9 F (36.6 C)  Resp: 22    Intake/Output Summary (Last 24 hours) at 03/28/14 0956 Last data filed at 03/28/14 0527  Gross per 24 hour  Intake 1038.33 ml  Output    502 ml  Net 536.33 ml   [employer]  03/27/14 0058  Weight: 112.991 kg (249 lb 1.6 oz)    Exam:   General:  Well nourished NAD  Cardiovascular: tachycardic but regular. No MGR No LE edema  Respiratory: normal effort  somewhat shallow. BS clear no wheeze  Abdomen: obese soft +BS non-tender flexaseal intact draining small amount liquid brown stool. Foley intact draining small amount very dark urine  Musculoskeletal: no clubbing no cyanosis  Neuro: responds to mild pain only  Data Reviewed: Basic Metabolic Panel:  Recent Labs Lab 03/26/14 2049 03/27/14 0631 03/28/14 0547  NA 138 139 143  K 4.0 4.7 4.7  CL 96 98 104  CO2 21 22 22   GLUCOSE 264* 304* 230*  BUN 24* 32* 58*  CREATININE 0.91 1.30* 2.55*  CALCIUM 9.6 8.7 8.5   Liver Function Tests:  Recent Labs Lab 03/26/14 2049  AST 34  ALT 19  ALKPHOS 83  BILITOT 0.7  PROT 7.5  ALBUMIN 3.5    Recent Labs Lab 03/26/14 2049  LIPASE 45   No results found for this basename: AMMONIA,  in the last 168 hours CBC:  Recent Labs Lab 03/26/14 2049 03/27/14 0631 03/28/14 0547  WBC 20.5* 24.9* 21.1*  NEUTROABS 17.3*  --   --   HGB 17.0* 16.0* 14.0  HCT 49.5* 47.1* 41.4  MCV 88.1 88.9 88.1  PLT 279 244 216   Cardiac Enzymes:  Recent Labs Lab 03/26/14 2049  TROPONINI <0.30   BNP (last 3 results)  Recent Labs  03/26/14 2049  PROBNP 231.2   CBG:  Recent Labs Lab 03/27/14 1130 03/27/14 1617 03/27/14 2145 03/28/14 0730  GLUCAP 341* 282* 238* 216*    Recent Results (from the past 240 hour(s))  URINE CULTURE     Status: None   Collection Time    03/26/14  9:57 PM      Result Value Ref Range Status   Specimen Description URINE, CATHETERIZED   Final   Special Requests NONE   Final   Culture  Setup Time     Final   Value: 03/27/2014 02:30     [facility]   Colony Count     Final   Value: >=100,000 COLONIES/ML     [facility] at [facility]   Culture     Final   Value: GRAM NEGATIVE RODS     [facility]formed at [facility]   Report Status PENDING   Incomplete  CLOSTRIDIUM DIFFICILE BY PCR     Status: None   Collection Time    03/26/14 10:10 PM      Result Value Ref Range Status    C difficile by pcr NEGATIVE  NEGATIVE Final  CULTURE, BLOOD (ROUTINE X 2)     Status: None   Collection Time    03/27/14  9:57 AM      Result Value Ref Range Status   Specimen Description Blood   Final   Special Requests NONE   Final   Culture NO GROWTH <24 HRS   Final   Report Status PENDING   Incomplete  CULTURE, BLOOD (ROUTINE X 2)     Status: None   Collection Time    03/27/14 10:03 AM      Result Value Ref Range Status   Specimen Description Blood   Final   Special Requests NONE   Final   Culture NO GROWTH <24 HRS   Final   Report Status PENDING   Incomplete     Studies: Ct Abdomen  Pelvis Wo Contrast  03/27/2014   CLINICAL DATA:  Persistent diarrhea/abdominal pain  EXAM: CT ABDOMEN AND PELVIS WITHOUT CONTRAST  TECHNIQUE: Multidetector CT imaging of the abdomen and pelvis was performed following the standard protocol without intravenous contrast.  COMPARISON:  None.  FINDINGS: Increased density is appreciated within the lung bases right greater than left.  A trace amount of perihepatic ascites is appreciated. The liver is otherwise unremarkable. The gallbladder and gallbladder fossa are unremarkable.  Noncontrast evaluation of the spleen, adrenals, kidneys is unremarkable. There is near complete fatty replacement of the pancreas visualized portions unremarkable.  Within the limitations of a noncontrast CT there is no evidence of enteritis, diverticulitis nor appendicitis. There is no evidence bowel obstruction. No abdominal or pelvic free fluid, loculated fluid collections, masses nor adenopathy. There is no evidence of abdominal aortic aneurysm.  Within the rectosigmoid colon, there are areas of bowel wall prominence representing either bowel wall edema versus partial decompression. Mild inflammatory change is also appreciated within the surrounding intraperitoneal fat. There is no evidence of free-fluid nor loculated fluid collections. There is no evidence of free air.  A fat containing  anterior abdominal wall hernia is appreciated measuring 8 cm in diameter. There is no evidence of an inguinal hernia.  Severe osteoarthritic changes are appreciated within the left hip. Multilevel spondylosis is appreciated within the spine. There is no aggressive appearing osseous lesions.  IMPRESSION: 1. Findings which may reflect early or mild proctitis. No associated drainable loculated fluid collections are appreciated. 2. Atelectasis versus infiltrates within the lung bases right greater than left. 3. Very small amount of perihepatic ascites likely reactive 4. Fact containing anterior abdominal wall hernia 5. Degenerative changes within the spine, and left hip.   Electronically Signed   By: Salome HolmesHector  Cooper M.D.   On: 03/27/2014 15:30   Ct Head Wo Contrast  03/26/2014   CLINICAL DATA:  Drug overdose.  Altered mental status.  EXAM: CT HEAD WITHOUT CONTRAST  TECHNIQUE: Contiguous axial images were obtained from the base of the skull through the vertex without intravenous contrast.  COMPARISON:  CT HEAD W/O CM dated 06/24/2004  FINDINGS: Chronic ischemic changes in the periventricular white matter and left caudate head. No mass effect, midline shift, or acute intracranial hemorrhage. Mastoid air cells are clear.  IMPRESSION: No acute intracranial pathology.   Electronically Signed   By: Maryclare BeanArt  Hoss M.D.   On: 03/26/2014 23:45   Mr Maxine GlennMra Head Wo Contrast  03/27/2014   CLINICAL DATA:  Diabetes and hypertension.  Altered mental status.  EXAM: MRI HEAD WITHOUT CONTRAST  MRA HEAD WITHOUT CONTRAST  TECHNIQUE: Multiplanar, multiecho pulse sequences of the brain and surrounding structures were obtained without intravenous contrast. Angiographic images of the head were obtained using MRA technique without contrast.  COMPARISON:  03/26/2014 CT.  No comparison MR.  FINDINGS: Focal web-like stenosis right middle cerebral artery bifurcation.  Moderate to marked narrowing middle cerebral artery branches bilaterally.  Occluded  right vertebral artery. Minimal retrograde flow distal right vertebral artery.  Non visualized posterior inferior cerebellar arteries.  Moderate narrowing anterior inferior cerebellar arteries.  Nonvisualized right superior cerebellar artery with moderate to marked narrowing left superior cerebral artery.  Moderate to marked web-like stenosis basilar tip.  Moderate to marked narrowing posterior cerebral artery mid to distal aspect bilaterally.  No aneurysm noted  IMPRESSION: MRI HEAD:  Acute moderate to large size medial left frontal lobe nonhemorrhagic infarct extending into the parietal lobe in a left anterior cerebral artery distribution.  Remote small basal ganglia and thalamic infarct some of which were partially hemorrhagic.  Small vessel disease type changes.  MRA HEAD:  Prominent intracranial atherosclerotic type changes as detailed above. This includes  abrupt cut off of flow proximal A2 segment left anterior cerebral artery consistent with patient's acute infarct.   Electronically Signed   By: Bridgett Larsson M.D.   On: 03/27/2014 13:56   Mr Brain Wo Contrast  03/27/2014   CLINICAL DATA:  Diabetes and hypertension.  Altered mental status.  EXAM: MRI HEAD WITHOUT CONTRAST  MRA HEAD WITHOUT CONTRAST  TECHNIQUE: Multiplanar, multiecho pulse sequences of the brain and surrounding structures were obtained without intravenous contrast. Angiographic images of the head were obtained using MRA technique without contrast.  COMPARISON:  03/26/2014 CT.  No comparison MR.  FINDINGS: Focal web-like stenosis right middle cerebral artery bifurcation.  Moderate to marked narrowing middle cerebral artery branches bilaterally.  Occluded right vertebral artery. Minimal retrograde flow distal right vertebral artery.  Non visualized posterior inferior cerebellar arteries.  Moderate narrowing anterior inferior cerebellar arteries.  Nonvisualized right superior cerebellar artery with moderate to marked narrowing left superior  cerebral artery.  Moderate to marked web-like stenosis basilar tip.  Moderate to marked narrowing posterior cerebral artery mid to distal aspect bilaterally.  No aneurysm noted  IMPRESSION: MRI HEAD:  Acute moderate to large size medial left frontal lobe nonhemorrhagic infarct extending into the parietal lobe in a left anterior cerebral artery distribution.  Remote small basal ganglia and thalamic infarct some of which were partially hemorrhagic.  Small vessel disease type changes.  MRA HEAD:  Prominent intracranial atherosclerotic type changes as detailed above. This includes  abrupt cut off of flow proximal A2 segment left anterior cerebral artery consistent with patient's acute infarct.   Electronically Signed   By: Bridgett Larsson M.D.   On: 03/27/2014 13:56   US Carotid Bilateral  03/27/2014   CLINICAL DATA:  Stroke, hypertension, altered mental status  EXAM: BILATERAL CAROTID DUPLEX ULTRASOUND  TECHNIQUE: Wallace Cullens scale imaging, color Doppler and duplex ultrasound was performed of bilateral carotid and vertebral arteries in the neck.  COMPARISON:  None.  REVIEW OF SYSTEMS: Quantification of carotid stenosis is based on velocity parameters that correlate the residual internal carotid diameter with NASCET-based stenosis levels, using the diameter of the distal internal carotid lumen as the denominator for stenosis measurement.  The following velocity measurements were obtained:  PEAK SYSTOLIC/END DIASTOLIC  RIGHT  ICA:                     85/16cm/sec  CCA:                     75/7cm/sec  SYSTOLIC ICA/CCA RATIO:  1.13  DIASTOLIC ICA/CCA RATIO: 2.26  ECA:                     110cm/sec  LEFT  ICA:                     83/10cm/sec  CCA:                     77/10cm/sec  SYSTOLIC ICA/CCA RATIO:  1.08  DIASTOLIC ICA/CCA RATIO: 1.0  ECA:                     86cm/sec  FINDINGS: RIGHT CAROTID ARTERY: Intimal thickening through the common carotid artery. There circumferential plaque in the carotid bulb.  There is eccentric  moderate partially calcified plaque in the proximal ICA resulting in at least mild stenosis. Normal waveforms and peak systolic velocities however. Distal ICA mildly tortuous.  RIGHT VERTEBRAL ARTERY:  Normal flow direction and waveform.  LEFT CAROTID ARTERY: Intimal thickening through the mildly tortuous common carotid artery. There is noncalcified plaque in the carotid bulb extending into the proximal ICA without high-grade stenosis. Normal waveforms and color Doppler signal. Distal ICA is tortuous.  LEFT VERTEBRAL ARTERY: Normal flow direction and waveform.  IMPRESSION: 1. Bilateral carotid bifurcation and proximal ICA plaque, resulting in less than 50% diameter stenosis. The exam does not exclude plaque ulceration or embolization. Continued surveillance recommended.   Electronically Signed   By: Oley Balm M.D.   On: 03/27/2014 16:53   Dg Chest Port 1 View  03/26/2014   CLINICAL DATA:  Shortness of Breath  EXAM: PORTABLE CHEST - 1 VIEW  COMPARISON:  December 19, 2012  FINDINGS: There is no edema or consolidation. Heart is mildly enlarged with normal pulmonary vascularity. No adenopathy. There is degenerative change in the thoracic spine.  IMPRESSION: Mild cardiac enlargement.  No edema or consolidation.   Electronically Signed   By: Bretta Bang M.D.   On: 03/26/2014 21:18   Dg Abd 2 Views  03/26/2014   CLINICAL DATA:  Diarrhea  EXAM: ABDOMEN - 2 VIEW  COMPARISON:  None.  FINDINGS: Study is limited due to body habitus. Gaseous distention of colon and possibly small bowel is present. Left pelvic phlebolith. No obvious free intraperitoneal gas.  IMPRESSION: Limited exam.  Distended loops of colon are nonspecific.   Electronically Signed   By: Maryclare Bean M.D.   On: 03/26/2014 23:29    Scheduled Meds: . acetaminophen  1,000 mg Intravenous 4 times per day  . antiseptic oral rinse  15 mL Mouth Rinse q12n4p  . aspirin  300 mg Rectal Daily  . chlorhexidine  15 mL Mouth/Throat BID  . insulin aspart   0-15 Units Subcutaneous TID WC  . insulin detemir  20 Units Subcutaneous Daily  . piperacillin-tazobactam (ZOSYN)  IV  3.375 g Intravenous Q8H  . sodium chloride  500 mL Intravenous Once  . sodium chloride  3 mL Intravenous Q12H   Continuous Infusions: . sodium chloride 100 mL/hr at 03/28/14 9562    Principal Problem:   Sepsis Active Problems:   DIABETES MELLITUS, TYPE II, UNCONTROLLED   Gastroenteritis, acute   UTI (lower urinary tract infection)   Syncope, vasovagal   Gastroenteritis   Acute renal failure   Acute encephalopathy   Acute ischemic stroke    Time spent: 35 minutes    Lesle Chris [facility]  [facility] Pager [phone]. If 7PM-7AM, please contact night-coverage at [email or web address], password [facility] 03/28/2014, 9:56 AM  LOS: 2 days

## 2014-03-28 NOTE — Plan of Care (Signed)
Problem: Phase I Progression Outcomes Goal: OOB as tolerated unless otherwise ordered Outcome: Not Progressing Patient has decreased LOC; does not follow commands

## 2014-03-28 NOTE — Progress Notes (Signed)
PROGRESS NOTE  Diane Rangel:[id number] DOB: 20-Jan-1934 DOA: 03/26/2014 PCP: Diane Conrad, MD  Summary:  80 year old female with a history of diabetes mellitus, hypertension, and irritable bowel syndrome presented with mental status change and diarrhea. The patient is unable to provide any history due to her acute encephalopathy. All the history is obtained from speaking with the patient's son at the bedside. The patient was in her usual state of health up until 6 PM on 03/26/2014. The patient had an acute onset of diarrhea without hematochezia or melena. The patient became weak after her for loose bowel movement, and her family later today around 6:30 PM. When the family came back approximately 10-15 minutes later, the patient was minimally responsive and not speaking. As a result, EMS was activated. In the ED, the patient had an episode of emesis, but the patient's son stated that she was able to communicate. There's been no recent travels, eating raw or undercooked foods, or sick contacts. The patient lives with her granddaughter and everyone else in the household is without any diarrheal illness. On the morning of 03/27/2014, the patient became more encephalopathic. MRI/MRA reveals acute cerebral infarct. In addition, the patient has developed acute kidney injury likely due to volume depletion from her profuse diarrhea. Assessment/Plan:  Acute nonhemorrhagic cerebral infarct  -MRI/MRA confirmed--left frontal infarct  - Bilateral carotid bifurcation and proximal ICA plaque, resulting  in less than 50% diameter stenosis per carotid doppler  - 2 decho with very poor acoustic windows limit study. Cannot fully evaluate RV systolic function. LV systolic function (based on parasternal views and apical 4 chamber view) is normal.  -lipids with HDL 33 otherwise ENL,  -HgA1c 6.8.  -evaluated by neurology who opine stroke workup unrevealing  -continue asa and statin  Sepsis  Present at the  time of admission  -Patient had tachycardia with leukocytosis with evidence of UTI -in setting of likely dehydration from GI losess  -temp spike 99.5 axillary. IV tylenol given. Await blood cultures  -urine culture with gram negative rods.  -atelectasis vs infiltrate bases of lungs r>L on CT. Concern for aspiration  -zosyn day #2,  - add vancomycin pending culture data  -D/C vancomycin 4/322/15 if blood culture remains neg -remains tachycardia with leukocytosis trending downward.  - UTI ; will obtain urine esosinophils  -will give bolus NS and increase fluid to 150/hr.  AKI --in setting of likely dehydration from GI losess  -Patient continued to have negative fluid balance -Normal saline 500 cc bolus and increased maintenance rate to 150 cc/hr -Renal ultrasound shows unremarkable right kidney; limited visibility of the left kidney due to body habitus Leukocytosis  -Multifactorial including aspiration pneumonitis, enteritis/proctitis, and UTI -Also partly due to UTI--urine culture with gram neg rod  -Blood cultures no growth to date  -Broaden antibiotic spectrum  - NPO  -CT abdomen pelvis with mild proctitis and atelectatsis vs infiltrate lung bases  Dysphagia -npo per speech -FAMILY IS STILL GIVING PATIENT LIQUIDS VIA STRAW -I agitated the family that the patient must be n.p.o. until cleared by speech therapy. Diarrhea  -Clostridium difficile PCR negative  -GI pathogen panel negative  -CT abdomen pelvis as above  -somewhat decreasing Diabetes mellitus  -Hemoglobin A1c 6.8  -NovoLog sliding scale  Hypertension  -Discontinue amlodipine, HCTZ, lisinopril as blood pressure is soft   Code Status:full  Family Communication: son AND granddaughterat bedside  Disposition Plan: unclear      Procedures/Studies: Ct  Abdomen Pelvis Wo Contrast  03/27/2014   CLINICAL DATA:  Persistent diarrhea/abdominal pain  EXAM: CT ABDOMEN AND PELVIS WITHOUT CONTRAST  TECHNIQUE:  Multidetector CT imaging of the abdomen and pelvis was performed following the standard protocol without intravenous contrast.  COMPARISON:  None.  FINDINGS: Increased density is appreciated within the lung bases right greater than left.  A trace amount of perihepatic ascites is appreciated. The liver is otherwise unremarkable. The gallbladder and gallbladder fossa are unremarkable.  Noncontrast evaluation of the spleen, adrenals, kidneys is unremarkable. There is near complete fatty replacement of the pancreas visualized portions unremarkable.  Within the limitations of a noncontrast CT there is no evidence of enteritis, diverticulitis nor appendicitis. There is no evidence bowel obstruction. No abdominal or pelvic free fluid, loculated fluid collections, masses nor adenopathy. There is no evidence of abdominal aortic aneurysm.  Within the rectosigmoid colon, there are areas of bowel wall prominence representing either bowel wall edema versus partial decompression. Mild inflammatory change is also appreciated within the surrounding intraperitoneal fat. There is no evidence of free-fluid nor loculated fluid collections. There is no evidence of free air.  A fat containing anterior abdominal wall hernia is appreciated measuring 8 cm in diameter. There is no evidence of an inguinal hernia.  Severe osteoarthritic changes are appreciated within the left hip. Multilevel spondylosis is appreciated within the spine. There is no aggressive appearing osseous lesions.  IMPRESSION: 1. Findings which may reflect early or mild proctitis. No associated drainable loculated fluid collections are appreciated. 2. Atelectasis versus infiltrates within the lung bases right greater than left. 3. Very small amount of perihepatic ascites likely reactive 4. Fact containing anterior abdominal wall hernia 5. Degenerative changes within the spine, and left hip.   Electronically Signed   By: Salome Holmes M.D.   On: 03/27/2014 15:30   Ct  Head Wo Contrast  03/26/2014   CLINICAL DATA:  Drug overdose.  Altered mental status.  EXAM: CT HEAD WITHOUT CONTRAST  TECHNIQUE: Contiguous axial images were obtained from the base of the skull through the vertex without intravenous contrast.  COMPARISON:  CT HEAD W/O CM dated 06/24/2004  FINDINGS: Chronic ischemic changes in the periventricular white matter and left caudate head. No mass effect, midline shift, or acute intracranial hemorrhage. Mastoid air cells are clear.  IMPRESSION: No acute intracranial pathology.   Electronically Signed   By: Maryclare Bean M.D.   On: 03/26/2014 23:45   Mr Maxine Glenn Head Wo Contrast  03/27/2014   CLINICAL DATA:  Diabetes and hypertension.  Altered mental status.  EXAM: MRI HEAD WITHOUT CONTRAST  MRA HEAD WITHOUT CONTRAST  TECHNIQUE: Multiplanar, multiecho pulse sequences of the brain and surrounding structures were obtained without intravenous contrast. Angiographic images of the head were obtained using MRA technique without contrast.  COMPARISON:  03/26/2014 CT.  No comparison MR.  FINDINGS: Focal web-like stenosis right middle cerebral artery bifurcation.  Moderate to marked narrowing middle cerebral artery branches bilaterally.  Occluded right vertebral artery. Minimal retrograde flow distal right vertebral artery.  Non visualized posterior inferior cerebellar arteries.  Moderate narrowing anterior inferior cerebellar arteries.  Nonvisualized right superior cerebellar artery with moderate to marked narrowing left superior cerebral artery.  Moderate to marked web-like stenosis basilar tip.  Moderate to marked narrowing posterior cerebral artery mid to distal aspect bilaterally.  No aneurysm noted  IMPRESSION: MRI HEAD:  Acute moderate to large size medial left frontal lobe nonhemorrhagic infarct extending into the parietal lobe in a left anterior cerebral artery  distribution.  Remote small basal ganglia and thalamic infarct some of which were partially hemorrhagic.  Small vessel  disease type changes.  MRA HEAD:  Prominent intracranial atherosclerotic type changes as detailed above. This includes  abrupt cut off of flow proximal A2 segment left anterior cerebral artery consistent with patient's acute infarct.   Electronically Signed   By: Bridgett Larsson M.D.   On: 03/27/2014 13:56   Mr Brain Wo Contrast  03/27/2014   CLINICAL DATA:  Diabetes and hypertension.  Altered mental status.  EXAM: MRI HEAD WITHOUT CONTRAST  MRA HEAD WITHOUT CONTRAST  TECHNIQUE: Multiplanar, multiecho pulse sequences of the brain and surrounding structures were obtained without intravenous contrast. Angiographic images of the head were obtained using MRA technique without contrast.  COMPARISON:  03/26/2014 CT.  No comparison MR.  FINDINGS: Focal web-like stenosis right middle cerebral artery bifurcation.  Moderate to marked narrowing middle cerebral artery branches bilaterally.  Occluded right vertebral artery. Minimal retrograde flow distal right vertebral artery.  Non visualized posterior inferior cerebellar arteries.  Moderate narrowing anterior inferior cerebellar arteries.  Nonvisualized right superior cerebellar artery with moderate to marked narrowing left superior cerebral artery.  Moderate to marked web-like stenosis basilar tip.  Moderate to marked narrowing posterior cerebral artery mid to distal aspect bilaterally.  No aneurysm noted  IMPRESSION: MRI HEAD:  Acute moderate to large size medial left frontal lobe nonhemorrhagic infarct extending into the parietal lobe in a left anterior cerebral artery distribution.  Remote small basal ganglia and thalamic infarct some of which were partially hemorrhagic.  Small vessel disease type changes.  MRA HEAD:  Prominent intracranial atherosclerotic type changes as detailed above. This includes  abrupt cut off of flow proximal A2 segment left anterior cerebral artery consistent with patient's acute infarct.   Electronically Signed   By: Bridgett Larsson M.D.   On:  03/27/2014 13:56   US Renal  03/28/2014   CLINICAL DATA:  Worsening renal function, hypertension, diabetes  EXAM: RENAL/URINARY TRACT ULTRASOUND COMPLETE  COMPARISON:  CT abdomen and pelvis 03/27/2014  TECHNIQUE: Sonography of the kidneys and urinary bladder performed. Exam quality degraded secondary to body habitus.  FINDINGS: Right Kidney:  Length: 8.9 cm. Limited visualization. Lower pole partially obscured. Grossly normal cortical echogenicity. Mild cortical thinning. No gross mass or hydronephrosis.  Left Kidney:  Length: 9.1 cm. Limited visualization unable to adequately visualized to exclude mass or obstruction.  Bladder:  Decompressed by Foley catheter, unable to evaluate  IMPRESSION: Extremely limited exam secondary to body habitus.  Grossly unremarkable right kidney.  Inadequate assessment of left kidney an urinary bladder.   Electronically Signed   By: Ulyses Southward M.D.   On: 03/28/2014 14:38   US Carotid Bilateral  03/27/2014   CLINICAL DATA:  Stroke, hypertension, altered mental status  EXAM: BILATERAL CAROTID DUPLEX ULTRASOUND  TECHNIQUE: Wallace Cullens scale imaging, color Doppler and duplex ultrasound was performed of bilateral carotid and vertebral arteries in the neck.  COMPARISON:  None.  REVIEW OF SYSTEMS: Quantification of carotid stenosis is based on velocity parameters that correlate the residual internal carotid diameter with NASCET-based stenosis levels, using the diameter of the distal internal carotid lumen as the denominator for stenosis measurement.  The following velocity measurements were obtained:  PEAK SYSTOLIC/END DIASTOLIC  RIGHT  ICA:                     85/16cm/sec  CCA:  75/7cm/sec  SYSTOLIC ICA/CCA RATIO:  1.13  DIASTOLIC ICA/CCA RATIO: 2.26  ECA:                     110cm/sec  LEFT  ICA:                     83/10cm/sec  CCA:                     77/10cm/sec  SYSTOLIC ICA/CCA RATIO:  1.08  DIASTOLIC ICA/CCA RATIO: 1.0  ECA:                     86cm/sec  FINDINGS:  RIGHT CAROTID ARTERY: Intimal thickening through the common carotid artery. There circumferential plaque in the carotid bulb. There is eccentric moderate partially calcified plaque in the proximal ICA resulting in at least mild stenosis. Normal waveforms and peak systolic velocities however. Distal ICA mildly tortuous.  RIGHT VERTEBRAL ARTERY:  Normal flow direction and waveform.  LEFT CAROTID ARTERY: Intimal thickening through the mildly tortuous common carotid artery. There is noncalcified plaque in the carotid bulb extending into the proximal ICA without high-grade stenosis. Normal waveforms and color Doppler signal. Distal ICA is tortuous.  LEFT VERTEBRAL ARTERY: Normal flow direction and waveform.  IMPRESSION: 1. Bilateral carotid bifurcation and proximal ICA plaque, resulting in less than 50% diameter stenosis. The exam does not exclude plaque ulceration or embolization. Continued surveillance recommended.   Electronically Signed   By: Oley Balm M.D.   On: 03/27/2014 16:53   Dg Chest Port 1 View  03/26/2014   CLINICAL DATA:  Shortness of Breath  EXAM: PORTABLE CHEST - 1 VIEW  COMPARISON:  December 19, 2012  FINDINGS: There is no edema or consolidation. Heart is mildly enlarged with normal pulmonary vascularity. No adenopathy. There is degenerative change in the thoracic spine.  IMPRESSION: Mild cardiac enlargement.  No edema or consolidation.   Electronically Signed   By: Bretta Bang M.D.   On: 03/26/2014 21:18   Dg Abd 2 Views  03/26/2014   CLINICAL DATA:  Diarrhea  EXAM: ABDOMEN - 2 VIEW  COMPARISON:  None.  FINDINGS: Study is limited due to body habitus. Gaseous distention of colon and possibly small bowel is present. Left pelvic phlebolith. No obvious free intraperitoneal gas.  IMPRESSION: Limited exam.  Distended loops of colon are nonspecific.   Electronically Signed   By: Maryclare Bean M.D.   On: 03/26/2014 23:29         Subjective: Patient is alert and awake, but has difficulty  forming words. She continues to have diarrhea. No respiratory distress, vomiting. She denies any pain or shortness of breath.  Objective: Filed Vitals:   03/28/14 0525 03/28/14 1030 03/28/14 1424 03/28/14 1733  BP: 121/50 108/66 123/53 125/61  Pulse: 102 93 104 108  Temp: 97.9 F (36.6 C) 99 F (37.2 C) 99.4 F (37.4 C) 98.9 F (37.2 C)  TempSrc: Axillary Axillary Axillary   Resp: 22 22 22 23   Height:      Weight:      SpO2: 96% 94% 96% 97%    Intake/Output Summary (Last 24 hours) at 03/28/14 1844 Last data filed at 03/28/14 1836  Gross per 24 hour  Intake      0 ml  Output    701 ml  Net   -701 ml   Weight change:  Exam:   General:  Pt is somnolent, does not follow commands  appropriately, not in acute distress  HEENT: No icterus, No thrush, [place]/AT  Cardiovascular: RRR, S1/S2, no rubs, no gallops  Respiratory: Bilateral scattered rales. No wheezing. Good air movement.  Abdomen: Soft/+BS, non tender, non distended, no guarding  Extremities: trace edema, No lymphangitis, No petechiae, No rashes, no synovitis  Data Reviewed: Basic Metabolic Panel:  Recent Labs Lab 03/26/14 2049 03/27/14 0631 03/28/14 0547  NA 138 139 143  K 4.0 4.7 4.7  CL 96 98 104  CO2 21 22 22   GLUCOSE 264* 304* 230*  BUN 24* 32* 58*  CREATININE 0.91 1.30* 2.55*  CALCIUM 9.6 8.7 8.5   Liver Function Tests:  Recent Labs Lab 03/26/14 2049  AST 34  ALT 19  ALKPHOS 83  BILITOT 0.7  PROT 7.5  ALBUMIN 3.5    Recent Labs Lab 03/26/14 2049  LIPASE 45   No results found for this basename: AMMONIA,  in the last 168 hours CBC:  Recent Labs Lab 03/26/14 2049 03/27/14 0631 03/28/14 0547  WBC 20.5* 24.9* 21.1*  NEUTROABS 17.3*  --   --   HGB 17.0* 16.0* 14.0  HCT 49.5* 47.1* 41.4  MCV 88.1 88.9 88.1  PLT 279 244 216   Cardiac Enzymes:  Recent Labs Lab 03/26/14 2049  TROPONINI <0.30   BNP: No components found with this basename: POCBNP,  CBG:  Recent Labs Lab  03/27/14 1617 03/27/14 2145 03/28/14 0730 03/28/14 1119 03/28/14 1604  GLUCAP 282* 238* 216* 156* 155*    Recent Results (from the past 240 hour(s))  URINE CULTURE     Status: None   Collection Time    03/26/14  9:57 PM      Result Value Ref Range Status   Specimen Description URINE, CATHETERIZED   Final   Special Requests NONE   Final   Culture  Setup Time     Final   Value: 03/27/2014 02:30     [facility]   Colony Count     Final   Value: >=100,000 COLONIES/ML     [facility]   Culture     Final   Value: KLEBSIELLA PNEUMONIAE     Performed at [facility]   Report Status 03/28/2014 FINAL   Final   Organism ID, Bacteria KLEBSIELLA PNEUMONIAE   Final  CLOSTRIDIUM DIFFICILE BY PCR     Status: None   Collection Time    03/26/14 10:10 PM      Result Value Ref Range Status   C difficile by pcr NEGATIVE  NEGATIVE Final  CULTURE, BLOOD (ROUTINE X 2)     Status: None   Collection Time    03/27/14  9:57 AM      Result Value Ref Range Status   Specimen Description BLOOD RIGHT ANTECUBITAL   Final   Special Requests     Final   Value: BOTTLES DRAWN AEROBIC AND ANAEROBIC AEB=9CC ANA=11CC   Culture NO GROWTH 1 DAY   Final   Report Status PENDING   Incomplete  CULTURE, BLOOD (ROUTINE X 2)     Status: None   Collection Time    03/27/14 10:03 AM      Result Value Ref Range Status   Specimen Description BLOOD LEFT HAND   Final   Special Requests BOTTLES DRAWN AEROBIC AND ANAEROBIC 10CC   Final   Culture NO GROWTH 1 DAY   Final   Report Status PENDING   Incomplete     Scheduled Meds: .  antiseptic oral rinse  15 mL Mouth Rinse q12n4p  . aspirin  300 mg Rectal Daily  . chlorhexidine  15 mL Mouth/Throat BID  . insulin aspart  0-15 Units Subcutaneous TID WC  . insulin detemir  20 Units Subcutaneous Daily  . piperacillin-tazobactam (ZOSYN)  IV  3.375 g Intravenous Q8H  . sodium chloride  3 mL Intravenous Q12H  . [START ON  03/30/2014] vancomycin  1,500 mg Intravenous Q48H   Continuous Infusions: . sodium chloride 150 mL/hr at 03/28/14 1814     Diane Hartshornavid Kery Batzel, Diane Rangel  [facility] Pager [phone]  If 7PM-7AM, please contact night-coverage [email or web address] Password [email or web address] 03/28/2014, 6:44 PM   LOS: 2 days

## 2014-03-28 NOTE — Consult Note (Signed)
[place] [address] A. Merlene Laughter, MD     [email or web address]          Diane Rangel is an 80 y.o. female.   ASSESSMENT/PLAN:  1. Acute multifactorial encephalopathy including acute cerebral infarct, dehydration, acute renal failure, sepsis and UTI. The findings are discussed at length with the family who is at the bedside. The patient has been placed on aspirin antiplatelet agent. DVT - prophylaxis. She already has had a complete stroke workup which is mostly unrevealing.  2. Acute left frontal ischemic infarct.  The patient presents with the acute onset of explosive diarrhea. She has had difficulties with constipation and was taking AMITIZA at home. It is postulated that this medication may have been the culprit for the copious diarrhea. Stool studies have been unremarkable. This was not the yesterday morning to be encephalopathic, unresponsive and with right-sided hemiparesis. She typically functions fairly well at baseline. She ambulates with a cane/walker but is otherwise highly functioning. They are no reports of cognitive impairment at baseline.The patient was noted to have acute infarction on imaging and hence a neurological consultation. She's been quite drowsy and unresponsive since then.   GENERAL: Obese female who is unresponsive.  HEENT: Supple. Atraumatic normocephalic.   ABDOMEN: soft  EXTREMITIES: No edema   BACK: Normal.  SKIN: Normal by inspection.    MENTAL STATUS: She lays in bed with eyes closed. She owns a right selective her up. She does not follow commands. There are no verbal output.  CRANIAL NERVES: Pupils are equal, round and reactive to light and accommodation; extra ocular movements are full, there is no significant nystagmus; upper and lower facial muscles are normal in strength and symmetric, there is no flattening of the nasolabial folds.  MOTOR: Goal weakness throughout likely due to lack of cooperation but there clearly is right-sided  hemiparesis. She moves her left side spontaneously.  COORDINATION: There are no dysmetria or tremors noted. No parkinsonism.  REFLEXES: Deep tendon reflexes are symmetrical and normal. Babinski reflexes are equivocal bilaterally.   SENSATION: She responds to painful some light bilaterally.     Past Medical History  Diagnosis Date  . Hypertension   . Diabetes mellitus without complication     Past Surgical History  Procedure Laterality Date  . Ankle surgery      No family history on file.  Social History:  reports that she has never smoked. She does not have any smokeless tobacco history on file. She reports that she does not drink alcohol or use illicit drugs.  Allergies: No Known Allergies  Medications: Prior to Admission medications   Medication Sig Start Date End Date Taking? Authorizing Provider  amLODipine (NORVASC) 10 MG tablet Take 10 mg by mouth daily. 03/03/14  Yes Historical Provider, MD  l-methylfolate-B6-B12 (METANX) 3-35-2 MG TABS Take 1 tablet by mouth daily.   Yes Historical Provider, MD  lisinopril-hydrochlorothiazide (PRINZIDE,ZESTORETIC) 20-12.5 MG per tablet Take 1 tablet by mouth daily. 03/07/14  Yes Historical Provider, MD  naproxen (NAPROSYN) 500 MG tablet Take 500 mg by mouth 2 (two) times daily with a meal. 03/04/14  Yes Historical Provider, MD  NOVOLIN N RELION 100 UNIT/ML injection Inject 30 Units into the skin 2 (two) times daily before a meal.  03/08/14  Yes Historical Provider, MD  polyethylene glycol powder (GLYCOLAX/MIRALAX) powder Take 17 g by mouth daily. 02/24/14  Yes Historical Provider, MD  ranitidine (ZANTAC) 150 MG tablet Take 150 mg by mouth 2 (two) times daily. 01/21/14  Yes Historical Provider,  MD  traMADol (ULTRAM) 50 MG tablet Take 50 mg by mouth every 12 (twelve) hours as needed for moderate pain. For pain 02/15/14  Yes Historical Provider, MD    Scheduled Meds: . acetaminophen  1,000 mg Intravenous 4 times per day  . antiseptic oral  rinse  15 mL Mouth Rinse q12n4p  . aspirin  300 mg Rectal Daily  . chlorhexidine  15 mL Mouth/Throat BID  . insulin aspart  0-20 Units Subcutaneous TID WC  . insulin aspart  0-5 Units Subcutaneous QHS  . piperacillin-tazobactam (ZOSYN)  IV  3.375 g Intravenous Q8H  . sodium chloride  3 mL Intravenous Q12H   Continuous Infusions: . sodium chloride 100 mL/hr at 03/28/14 0832   PRN Meds:.sodium chloride, ondansetron (ZOFRAN) IV, ondansetron, sodium chloride   Blood pressure 121/50, pulse 102, temperature 97.9 F (36.6 C), temperature source Axillary, resp. rate 22, height 5' 10"  (1.778 m), weight 112.991 kg (249 lb 1.6 oz), SpO2 96.00%.   Results for orders placed during the hospital encounter of 03/26/14 (from the past 48 hour(s))  CBC WITH DIFFERENTIAL     Status: Abnormal   Collection Time    03/26/14  8:49 PM      Result Value Ref Range   WBC 20.5 (*) 4.0 - 10.5 K/uL   RBC 5.62 (*) 3.87 - 5.11 MIL/uL   Hemoglobin 17.0 (*) 12.0 - 15.0 g/dL   HCT 49.5 (*) 36.0 - 46.0 %   MCV 88.1  78.0 - 100.0 fL   MCH 30.2  26.0 - 34.0 pg   MCHC 34.3  30.0 - 36.0 g/dL   RDW 13.8  11.5 - 15.5 %   Platelets 279  150 - 400 K/uL   Neutrophils Relative % 84 (*) 43 - 77 %   Neutro Abs 17.3 (*) 1.7 - 7.7 K/uL   Lymphocytes Relative 10 (*) 12 - 46 %   Lymphs Abs 1.9  0.7 - 4.0 K/uL   Monocytes Relative 5  3 - 12 %   Monocytes Absolute 1.1 (*) 0.1 - 1.0 K/uL   Eosinophils Relative 1  0 - 5 %   Eosinophils Absolute 0.2  0.0 - 0.7 K/uL   Basophils Relative 0  0 - 1 %   Basophils Absolute 0.0  0.0 - 0.1 K/uL  COMPREHENSIVE METABOLIC PANEL     Status: Abnormal   Collection Time    03/26/14  8:49 PM      Result Value Ref Range   Sodium 138  137 - 147 mEq/L   Potassium 4.0  3.7 - 5.3 mEq/L   Chloride 96  96 - 112 mEq/L   CO2 21  19 - 32 mEq/L   Glucose, Bld 264 (*) 70 - 99 mg/dL   BUN 24 (*) 6 - 23 mg/dL   Creatinine, Ser 0.91  0.50 - 1.10 mg/dL   Calcium 9.6  8.4 - 10.5 mg/dL   Total Protein  7.5  6.0 - 8.3 g/dL   Albumin 3.5  3.5 - 5.2 g/dL   AST 34  0 - 37 U/L   ALT 19  0 - 35 U/L   Alkaline Phosphatase 83  39 - 117 U/L   Total Bilirubin 0.7  0.3 - 1.2 mg/dL   GFR calc non Af Amer 59 (*) >90 mL/min   GFR calc Af Amer 68 (*) >90 mL/min   Comment: (NOTE)     The eGFR has been calculated using the CKD EPI equation.  This calculation has not been validated in all clinical situations.     eGFR's persistently <90 mL/min signify possible Chronic Kidney     Disease.  TROPONIN I     Status: None   Collection Time    03/26/14  8:49 PM      Result Value Ref Range   Troponin I <0.30  <0.30 ng/mL   Comment:            Due to the release kinetics of cTnI,     a negative result within the first hours     of the onset of symptoms does not rule out     myocardial infarction with certainty.     If myocardial infarction is still suspected,     repeat the test at appropriate intervals.  ACETAMINOPHEN LEVEL     Status: None   Collection Time    03/26/14  8:49 PM      Result Value Ref Range   Acetaminophen (Tylenol), Serum <15.0  10 - 30 ug/mL   Comment:            THERAPEUTIC CONCENTRATIONS VARY     SIGNIFICANTLY. A RANGE OF 10-30     ug/mL MAY BE AN EFFECTIVE     CONCENTRATION FOR MANY PATIENTS.     HOWEVER, SOME ARE BEST TREATED     AT CONCENTRATIONS OUTSIDE THIS     RANGE.     ACETAMINOPHEN CONCENTRATIONS     >150 ug/mL AT 4 HOURS AFTER     INGESTION AND >50 ug/mL AT 12     HOURS AFTER INGESTION ARE     OFTEN ASSOCIATED WITH TOXIC     REACTIONS.  PRO B NATRIURETIC PEPTIDE     Status: None   Collection Time    03/26/14  8:49 PM      Result Value Ref Range   Pro B Natriuretic peptide (BNP) 231.2  0 - 450 pg/mL  LACTIC ACID, PLASMA     Status: Abnormal   Collection Time    03/26/14  8:49 PM      Result Value Ref Range   Lactic Acid, Venous 2.7 (*) 0.5 - 2.2 mmol/L  LIPASE, BLOOD     Status: None   Collection Time    03/26/14  8:49 PM      Result Value Ref Range    Lipase 45  11 - 59 U/L  URINALYSIS, ROUTINE W REFLEX MICROSCOPIC     Status: Abnormal   Collection Time    03/26/14  9:57 PM      Result Value Ref Range   Color, Urine AMBER (*) YELLOW   Comment: BIOCHEMICALS MAY BE AFFECTED BY COLOR   APPearance CLOUDY (*) CLEAR   Specific Gravity, Urine 1.015  1.005 - 1.030   pH 8.0  5.0 - 8.0   Glucose, UA NEGATIVE  NEGATIVE mg/dL   Hgb urine dipstick LARGE (*) NEGATIVE   Bilirubin Urine SMALL (*) NEGATIVE   Ketones, ur NEGATIVE  NEGATIVE mg/dL   Protein, ur 30 (*) NEGATIVE mg/dL   Urobilinogen, UA 4.0 (*) 0.0 - 1.0 mg/dL   Nitrite NEGATIVE  NEGATIVE   Leukocytes, UA SMALL (*) NEGATIVE  URINE RAPID DRUG SCREEN (HOSP PERFORMED)     Status: None   Collection Time    03/26/14  9:57 PM      Result Value Ref Range   Opiates NONE DETECTED  NONE DETECTED   Cocaine NONE DETECTED  NONE DETECTED   Benzodiazepines NONE  DETECTED  NONE DETECTED   Amphetamines NONE DETECTED  NONE DETECTED   Tetrahydrocannabinol NONE DETECTED  NONE DETECTED   Barbiturates NONE DETECTED  NONE DETECTED   Comment:            DRUG SCREEN FOR MEDICAL PURPOSES     ONLY.  IF CONFIRMATION IS NEEDED     FOR ANY PURPOSE, NOTIFY LAB     WITHIN 5 DAYS.                LOWEST DETECTABLE LIMITS     FOR URINE DRUG SCREEN     Drug Class       Cutoff (ng/mL)     Amphetamine      1000     Barbiturate      200     Benzodiazepine   659     Tricyclics       935     Opiates          300     Cocaine          300     THC              50  URINE MICROSCOPIC-ADD ON     Status: Abnormal   Collection Time    03/26/14  9:57 PM      Result Value Ref Range   WBC, UA TOO NUMEROUS TO COUNT  <3 WBC/hpf   RBC / HPF 3-6  <3 RBC/hpf   Bacteria, UA MANY (*) RARE  URINE CULTURE     Status: None   Collection Time    03/26/14  9:57 PM      Result Value Ref Range   Specimen Description URINE, CATHETERIZED     Special Requests NONE     Culture  Setup Time       Value: 03/27/2014 02:30      Performed at [facility] Count       Value: >=100,000 COLONIES/ML     Performed at [facility]   Culture       Value: [place]     Performed at [facility]   Report Status PENDING    GI PATHOGEN PANEL BY PCR, STOOL     Status: None   Collection Time    03/26/14 10:10 PM      Result Value Ref Range   Campylobacter by PCR Negative     C difficile toxin A/B Negative     E coli 0157 by PCR Negative     E coli (ETEC) LT/ST Negative     E coli (STEC) Negative     Salmonella by PCR Negative     Shigella by PCR Negative     Norovirus G!/G2 Negative     Rotavirus A by PCR Negative     G lamblia by PCR Negative     Cryptosporidium by PCR Negative     Comment: (NOTE)      ** Normal Reference Range for each Analyte: Not Detected **           The detection and identification of specific gastrointestinal     microbial nucleic acid from individuals exhibiting signs and     symptoms of gastrointestinal infection aids in the diagnosis     of gastrointestinal infection when used in conjunction with     clinical evaluation, laboratory findings, and epidemiological     information.     ** The xTAG  Gastrointestinal Pathogen Panel results are presumptive     and must be confirmed by FDA-cleared tests or other acceptable     reference methods.  The results of this test should not be used as     the sole basis for diagnosis, treatment, or other patient management     decisions.     Performed using the [employer] xTAG Gastrointestinal Pathogen Panel test     kit.     Performed at [facility] DIFFICILE BY PCR     Status: None   Collection Time    03/26/14 10:10 PM      Result Value Ref Range   C difficile by pcr NEGATIVE  NEGATIVE  OCCULT BLOOD, POC DEVICE     Status: Abnormal   Collection Time    03/26/14 10:16 PM      Result Value Ref Range   Fecal Occult Bld POSITIVE (*) NEGATIVE  HEMOGLOBIN A1C     Status: Abnormal    Collection Time    03/27/14  6:26 AM      Result Value Ref Range   Hemoglobin A1C 6.8 (*) <5.7 %   Comment: (NOTE)                                                                               According to the ADA Clinical Practice Recommendations for 2011, when     HbA1c is used as a screening test:      >=6.5%   Diagnostic of Diabetes Mellitus               (if abnormal result is confirmed)     5.7-6.4%   Increased risk of developing Diabetes Mellitus     References:Diagnosis and Classification of Diabetes Mellitus,Diabetes     IYME,1583,09(MMHWK 1):S62-S69 and Standards of Medical Care in             Diabetes - 2011,Diabetes Care,2011,34 (Suppl 1):S11-S61.   Mean Plasma Glucose 148 (*) <117 mg/dL   Comment: Performed at [facility]: Abnormal   Collection Time    03/27/14  6:26 AM      Result Value Ref Range   Cholesterol 153  0 - 200 mg/dL   Triglycerides 144  <150 mg/dL   HDL 33 (*) >39 mg/dL   Total CHOL/HDL Ratio 4.6     VLDL 29  0 - 40 mg/dL   LDL Cholesterol 91  0 - 99 mg/dL   Comment:            Total Cholesterol/HDL:CHD Risk     Coronary Heart Disease Risk Table                         Men   Women      1/2 Average Risk   3.4   3.3      Average Risk       5.0   4.4      2 X Average Risk   9.6   7.1      3 X Average Risk  23.4   11.0  Use the calculated Patient Ratio     above and the CHD Risk Table     to determine the patient's CHD Risk.                ATP III CLASSIFICATION (LDL):      <100     mg/dL   Optimal      100-129  mg/dL   Near or Above                        Optimal      130-159  mg/dL   Borderline      160-189  mg/dL   High      >190     mg/dL   Very High  BASIC METABOLIC PANEL     Status: Abnormal   Collection Time    03/27/14  6:31 AM      Result Value Ref Range   Sodium 139  137 - 147 mEq/L   Potassium 4.7  3.7 - 5.3 mEq/L   Chloride 98  96 - 112 mEq/L   CO2 22  19 - 32 mEq/L   Glucose, Bld  304 (*) 70 - 99 mg/dL   BUN 32 (*) 6 - 23 mg/dL   Creatinine, Ser 1.30 (*) 0.50 - 1.10 mg/dL   Calcium 8.7  8.4 - 10.5 mg/dL   GFR calc non Af Amer 38 (*) >90 mL/min   GFR calc Af Amer 44 (*) >90 mL/min   Comment: (NOTE)     The eGFR has been calculated using the CKD EPI equation.     This calculation has not been validated in all clinical situations.     eGFR's persistently <90 mL/min signify possible Chronic Kidney     Disease.  CBC     Status: Abnormal   Collection Time    03/27/14  6:31 AM      Result Value Ref Range   WBC 24.9 (*) 4.0 - 10.5 K/uL   RBC 5.30 (*) 3.87 - 5.11 MIL/uL   Hemoglobin 16.0 (*) 12.0 - 15.0 g/dL   HCT 47.1 (*) 36.0 - 46.0 %   MCV 88.9  78.0 - 100.0 fL   MCH 30.2  26.0 - 34.0 pg   MCHC 34.0  30.0 - 36.0 g/dL   RDW 14.1  11.5 - 15.5 %   Platelets 244  150 - 400 K/uL  CULTURE, BLOOD (ROUTINE X 2)     Status: None   Collection Time    03/27/14  9:57 AM      Result Value Ref Range   Specimen Description Blood     Special Requests NONE     Culture NO GROWTH <24 HRS     Report Status PENDING    CULTURE, BLOOD (ROUTINE X 2)     Status: None   Collection Time    03/27/14 10:03 AM      Result Value Ref Range   Specimen Description Blood     Special Requests NONE     Culture NO GROWTH <24 HRS     Report Status PENDING    GLUCOSE, CAPILLARY     Status: Abnormal   Collection Time    03/27/14 11:30 AM      Result Value Ref Range   Glucose-Capillary 341 (*) 70 - 99 mg/dL   Comment 1 Documented in Chart     Comment 2 Notify RN    GLUCOSE, CAPILLARY  Status: Abnormal   Collection Time    03/27/14  4:17 PM      Result Value Ref Range   Glucose-Capillary 282 (*) 70 - 99 mg/dL   Comment 1 Documented in Chart     Comment 2 Notify RN    GLUCOSE, CAPILLARY     Status: Abnormal   Collection Time    03/27/14  9:45 PM      Result Value Ref Range   Glucose-Capillary 238 (*) 70 - 99 mg/dL   Comment 1 Documented in Chart     Comment 2 Notify RN    CBC      Status: Abnormal   Collection Time    03/28/14  5:47 AM      Result Value Ref Range   WBC 21.1 (*) 4.0 - 10.5 K/uL   RBC 4.70  3.87 - 5.11 MIL/uL   Hemoglobin 14.0  12.0 - 15.0 g/dL   HCT 41.4  36.0 - 46.0 %   MCV 88.1  78.0 - 100.0 fL   MCH 29.8  26.0 - 34.0 pg   MCHC 33.8  30.0 - 36.0 g/dL   RDW 14.6  11.5 - 15.5 %   Platelets 216  150 - 400 K/uL  BASIC METABOLIC PANEL     Status: Abnormal   Collection Time    03/28/14  5:47 AM      Result Value Ref Range   Sodium 143  137 - 147 mEq/L   Potassium 4.7  3.7 - 5.3 mEq/L   Chloride 104  96 - 112 mEq/L   CO2 22  19 - 32 mEq/L   Glucose, Bld 230 (*) 70 - 99 mg/dL   BUN 58 (*) 6 - 23 mg/dL   Comment: DELTA CHECK NOTED   Creatinine, Ser 2.55 (*) 0.50 - 1.10 mg/dL   Calcium 8.5  8.4 - 10.5 mg/dL   GFR calc non Af Amer 17 (*) >90 mL/min   GFR calc Af Amer 20 (*) >90 mL/min   Comment: (NOTE)     The eGFR has been calculated using the CKD EPI equation.     This calculation has not been validated in all clinical situations.     eGFR's persistently <90 mL/min signify possible Chronic Kidney     Disease.  GLUCOSE, CAPILLARY     Status: Abnormal   Collection Time    03/28/14  7:30 AM      Result Value Ref Range   Glucose-Capillary 216 (*) 70 - 99 mg/dL    Ct Abdomen Pelvis Wo Contrast  03/27/2014   CLINICAL DATA:  Persistent diarrhea/abdominal pain  EXAM: CT ABDOMEN AND PELVIS WITHOUT CONTRAST  TECHNIQUE: Multidetector CT imaging of the abdomen and pelvis was performed following the standard protocol without intravenous contrast.  COMPARISON:  None.  FINDINGS: Increased density is appreciated within the lung bases right greater than left.  A trace amount of perihepatic ascites is appreciated. The liver is otherwise unremarkable. The gallbladder and gallbladder fossa are unremarkable.  Noncontrast evaluation of the spleen, adrenals, kidneys is unremarkable. There is near complete fatty replacement of the pancreas visualized portions  unremarkable.  Within the limitations of a noncontrast CT there is no evidence of enteritis, diverticulitis nor appendicitis. There is no evidence bowel obstruction. No abdominal or pelvic free fluid, loculated fluid collections, masses nor adenopathy. There is no evidence of abdominal aortic aneurysm.  Within the rectosigmoid colon, there are areas of bowel wall prominence representing either bowel wall edema versus partial  decompression. Mild inflammatory change is also appreciated within the surrounding intraperitoneal fat. There is no evidence of free-fluid nor loculated fluid collections. There is no evidence of free air.  A fat containing anterior abdominal wall hernia is appreciated measuring 8 cm in diameter. There is no evidence of an inguinal hernia.  Severe osteoarthritic changes are appreciated within the left hip. Multilevel spondylosis is appreciated within the spine. There is no aggressive appearing osseous lesions.  IMPRESSION: 1. Findings which may reflect early or mild proctitis. No associated drainable loculated fluid collections are appreciated. 2. Atelectasis versus infiltrates within the lung bases right greater than left. 3. Very small amount of perihepatic ascites likely reactive 4. Fact containing anterior abdominal wall hernia 5. Degenerative changes within the spine, and left hip.   Electronically Signed   By: Margaree Mackintosh M.D.   On: 03/27/2014 15:30   Ct Head Wo Contrast  03/26/2014   CLINICAL DATA:  Drug overdose.  Altered mental status.  EXAM: CT HEAD WITHOUT CONTRAST  TECHNIQUE: Contiguous axial images were obtained from the base of the skull through the vertex without intravenous contrast.  COMPARISON:  CT HEAD W/O CM dated 06/24/2004  FINDINGS: Chronic ischemic changes in the periventricular white matter and left caudate head. No mass effect, midline shift, or acute intracranial hemorrhage. Mastoid air cells are clear.  IMPRESSION: No acute intracranial pathology.    Electronically Signed   By: Maryclare Bean M.D.   On: 03/26/2014 23:45   Mr Jodene Nam Head Wo Contrast  03/27/2014   CLINICAL DATA:  Diabetes and hypertension.  Altered mental status.  EXAM: MRI HEAD WITHOUT CONTRAST  MRA HEAD WITHOUT CONTRAST  TECHNIQUE: Multiplanar, multiecho pulse sequences of the brain and surrounding structures were obtained without intravenous contrast. Angiographic images of the head were obtained using MRA technique without contrast.  COMPARISON:  03/26/2014 CT.  No comparison MR.  FINDINGS: Focal web-like stenosis right middle cerebral artery bifurcation.  Moderate to marked narrowing middle cerebral artery branches bilaterally.  Occluded right vertebral artery. Minimal retrograde flow distal right vertebral artery.  Non visualized posterior inferior cerebellar arteries.  Moderate narrowing anterior inferior cerebellar arteries.  Nonvisualized right superior cerebellar artery with moderate to marked narrowing left superior cerebral artery.  Moderate to marked web-like stenosis basilar tip.  Moderate to marked narrowing posterior cerebral artery mid to distal aspect bilaterally.  No aneurysm noted  IMPRESSION: MRI HEAD:  Acute moderate to large size medial left frontal lobe nonhemorrhagic infarct extending into the parietal lobe in a left anterior cerebral artery distribution.  Remote small basal ganglia and thalamic infarct some of which were partially hemorrhagic.  Small vessel disease type changes.  MRA HEAD:  Prominent intracranial atherosclerotic type changes as detailed above. This includes  abrupt cut off of flow proximal A2 segment left anterior cerebral artery consistent with patient's acute infarct.   Electronically Signed   By: Chauncey Cruel M.D.   On: 03/27/2014 13:56   Mr Brain Wo Contrast  03/27/2014   CLINICAL DATA:  Diabetes and hypertension.  Altered mental status.  EXAM: MRI HEAD WITHOUT CONTRAST  MRA HEAD WITHOUT CONTRAST  TECHNIQUE: Multiplanar, multiecho pulse sequences of the  brain and surrounding structures were obtained without intravenous contrast. Angiographic images of the head were obtained using MRA technique without contrast.  COMPARISON:  03/26/2014 CT.  No comparison MR.  FINDINGS: Focal web-like stenosis right middle cerebral artery bifurcation.  Moderate to marked narrowing middle cerebral artery branches bilaterally.  Occluded right vertebral artery. Minimal  retrograde flow distal right vertebral artery.  Non visualized posterior inferior cerebellar arteries.  Moderate narrowing anterior inferior cerebellar arteries.  Nonvisualized right superior cerebellar artery with moderate to marked narrowing left superior cerebral artery.  Moderate to marked web-like stenosis basilar tip.  Moderate to marked narrowing posterior cerebral artery mid to distal aspect bilaterally.  No aneurysm noted  IMPRESSION: MRI HEAD:  Acute moderate to large size medial left frontal lobe nonhemorrhagic infarct extending into the parietal lobe in a left anterior cerebral artery distribution.  Remote small basal ganglia and thalamic infarct some of which were partially hemorrhagic.  Small vessel disease type changes.  MRA HEAD:  Prominent intracranial atherosclerotic type changes as detailed above. This includes  abrupt cut off of flow proximal A2 segment left anterior cerebral artery consistent with patient's acute infarct.   Electronically Signed   By: Chauncey Cruel M.D.   On: 03/27/2014 13:56   US Carotid Bilateral  03/27/2014   CLINICAL DATA:  Stroke, hypertension, altered mental status  EXAM: BILATERAL CAROTID DUPLEX ULTRASOUND  TECHNIQUE: Pearline Cables scale imaging, color Doppler and duplex ultrasound was performed of bilateral carotid and vertebral arteries in the neck.  COMPARISON:  None.  REVIEW OF SYSTEMS: Quantification of carotid stenosis is based on velocity parameters that correlate the residual internal carotid diameter with NASCET-based stenosis levels, using the diameter of the distal  internal carotid lumen as the denominator for stenosis measurement.  The following velocity measurements were obtained:  PEAK SYSTOLIC/END DIASTOLIC  RIGHT  ICA:                     85/16cm/sec  CCA:                     39/0ZE/SPQ  SYSTOLIC ICA/CCA RATIO:  3.30  DIASTOLIC ICA/CCA RATIO: 0.76  ECA:                     110cm/sec  LEFT  ICA:                     83/10cm/sec  CCA:                     22/63FH/LKT  SYSTOLIC ICA/CCA RATIO:  6.25  DIASTOLIC ICA/CCA RATIO: 1.0  ECA:                     86cm/sec  FINDINGS: RIGHT CAROTID ARTERY: Intimal thickening through the common carotid artery. There circumferential plaque in the carotid bulb. There is eccentric moderate partially calcified plaque in the proximal ICA resulting in at least mild stenosis. Normal waveforms and peak systolic velocities however. Distal ICA mildly tortuous.  RIGHT VERTEBRAL ARTERY:  Normal flow direction and waveform.  LEFT CAROTID ARTERY: Intimal thickening through the mildly tortuous common carotid artery. There is noncalcified plaque in the carotid bulb extending into the proximal ICA without high-grade stenosis. Normal waveforms and color Doppler signal. Distal ICA is tortuous.  LEFT VERTEBRAL ARTERY: Normal flow direction and waveform.  IMPRESSION: 1. Bilateral carotid bifurcation and proximal ICA plaque, resulting in less than 50% diameter stenosis. The exam does not exclude plaque ulceration or embolization. Continued surveillance recommended.   Electronically Signed   By: Arne Cleveland M.D.   On: 03/27/2014 16:53   Dg Chest Port 1 View  03/26/2014   CLINICAL DATA:  Shortness of Breath  EXAM: PORTABLE CHEST - 1 VIEW  COMPARISON:  December 19, 2012  FINDINGS: There is no edema or consolidation. Heart is mildly enlarged with normal pulmonary vascularity. No adenopathy. There is degenerative change in the thoracic spine.  IMPRESSION: Mild cardiac enlargement.  No edema or consolidation.   Electronically Signed   By: Lowella Grip M.D.    On: 03/26/2014 21:18   Dg Abd 2 Views  03/26/2014   CLINICAL DATA:  Diarrhea  EXAM: ABDOMEN - 2 VIEW  COMPARISON:  None.  FINDINGS: Study is limited due to body habitus. Gaseous distention of colon and possibly small bowel is present. Left pelvic phlebolith. No obvious free intraperitoneal gas.  IMPRESSION: Limited exam.  Distended loops of colon are nonspecific.   Electronically Signed   By: Maryclare Bean M.D.   On: 03/26/2014 23:29    ECO ------------------------------------------------------------ Study Conclusions  Left ventricle: The cavity size was normal. Wall thickness was increased in a pattern of mild LVH. Doppler parameters are consistent with abnormal left ventricular relaxation (grade 1 diastolic dysfunction).  Impressions:  - Very poor acoustic windows limit study. Cannot fully evaluate RV systolic function. LV systolic function (based on parasternal views and apical 4 chamber view) is normal. Transthoracic echocardiography. M-mode, limited 2D, limited      Tiffney Haughton A. Merlene Laughter, M.D.  Diplomate, Tax adviser of Psychiatry and Neurology ( Neurology). 03/28/2014, 8:46 AM

## 2014-03-29 ENCOUNTER — Inpatient Hospital Stay (HOSPITAL_COMMUNITY): Payer: Medicare Other

## 2014-03-29 DIAGNOSIS — E119 Type 2 diabetes mellitus without complications: Secondary | ICD-10-CM

## 2014-03-29 LAB — GLUCOSE, CAPILLARY
GLUCOSE-CAPILLARY: 109 mg/dL — AB (ref 70–99)
GLUCOSE-CAPILLARY: 105 mg/dL — AB (ref 70–99)
GLUCOSE-CAPILLARY: 123 mg/dL — AB (ref 70–99)
GLUCOSE-CAPILLARY: 132 mg/dL — AB (ref 70–99)
Glucose-Capillary: 133 mg/dL — ABNORMAL HIGH (ref 70–99)
Glucose-Capillary: 98 mg/dL (ref 70–99)

## 2014-03-29 LAB — CBC
HEMATOCRIT: 37.9 % (ref 36.0–46.0)
Hemoglobin: 12.5 g/dL (ref 12.0–15.0)
MCH: 29.4 pg (ref 26.0–34.0)
MCHC: 33 g/dL (ref 30.0–36.0)
MCV: 89.2 fL (ref 78.0–100.0)
Platelets: 207 10*3/uL (ref 150–400)
RBC: 4.25 MIL/uL (ref 3.87–5.11)
RDW: 15.2 % (ref 11.5–15.5)
WBC: 14.4 10*3/uL — ABNORMAL HIGH (ref 4.0–10.5)

## 2014-03-29 LAB — BASIC METABOLIC PANEL
BUN: 58 mg/dL — ABNORMAL HIGH (ref 6–23)
CALCIUM: 8.2 mg/dL — AB (ref 8.4–10.5)
CHLORIDE: 112 meq/L (ref 96–112)
CO2: 22 meq/L (ref 19–32)
CREATININE: 1.74 mg/dL — AB (ref 0.50–1.10)
GFR calc Af Amer: 31 mL/min — ABNORMAL LOW (ref >90)
GFR calc non Af Amer: 27 mL/min — ABNORMAL LOW (ref >90)
GLUCOSE: 115 mg/dL — AB (ref 70–99)
Potassium: 3.7 mEq/L (ref 3.7–5.3)
Sodium: 148 mEq/L — ABNORMAL HIGH (ref 137–147)

## 2014-03-29 MED ORDER — MORPHINE SULFATE 2 MG/ML IJ SOLN
0.5000 mg | INTRAMUSCULAR | Status: DC | PRN
  Administered 2014-03-29: 1 mg via INTRAVENOUS
  Filled 2014-03-29: qty 1

## 2014-03-29 MED ORDER — INSULIN DETEMIR 100 UNIT/ML ~~LOC~~ SOLN
10.0000 [IU] | Freq: Every day | SUBCUTANEOUS | Status: DC
  Administered 2014-03-30 – 2014-03-31 (×2): 10 [IU] via SUBCUTANEOUS
  Filled 2014-03-29 (×5): qty 0.1

## 2014-03-29 NOTE — Progress Notes (Signed)
Speech Language Pathology Treatment: Dysphagia  Patient Details Name: Dellie Catholicnez P Blow MRN: [id number] DOB: May 16, 1934 Today's Date: 03/29/2014 Time: 1040-1100 SLP Time Calculation (min): 20 min  Assessment / Plan / Recommendation Clinical Impression  F/u from BSE to assess PO readiness.  Dramatic improvement in LOA with ability to follow one to two step directives.  Immediate + s/s of aspiration with thin water trials by teaspoon due to decreased sensory/delayed initiation.  No outward clinical s/s with nectar thick liquids and puree trials in small amounts but patient noted with increased WOB and decreased reserve.   Overall improvements made but still presenting with decreased ability to protect airway fully with PO's.  Recommend to continue NPO status and proceed with objective evaluation to assess risk for aspiration and determine safest, PO diet.  MBS to be completed 03/30/14.  Due to improvements made this treatment hopeful to resume PO's next date.     HPI HPI: 80 year old female with a history of diabetes mellitus, hypertension, and irritable bowel syndrome presented with mental status change and diarrhea.  The patient was in her usual state of health up until 6 PM on 03/26/2014. The patient had an acute onset of diarrhea without hematochezia or melena. The patient became weak after her for loose bowel movement, and her family later today around 6:30 PM. When the family came back approximately 10-15 minutes later, the patient was minimally responsive and not speaking. As a result, EMS was activated. In the ED, the patient had an episode of emesis, but the patient's son stated that she was able to communicate. The patient lives with her granddaughter and everyone else in the household is without any diarrheal illness. On the morning of 03/27/2014, the patient became more encephalopathic. MRI: Acute moderate to large size medial left frontal lobe nonhemorrhagic infarct extending into the parietal lobe  in a left anterior cerebral artery distribution.      SLP Plan  MBS;New goals to be determined pending instrumental study    Recommendations Diet recommendations: NPO Medication Administration: Via alternative means              Oral Care Recommendations: Oral care Q4 per protocol Follow up Recommendations: Skilled Nursing facility Plan: MBS;New goals to be determined pending instrumental study    GO  Moreen FowlerKaren Jace Fermin MS, CCC-SLP [phone]  Lacinda AxonKaren H Myalynn Lingle 03/29/2014, 11:18 AM

## 2014-03-29 NOTE — Progress Notes (Signed)
Diane Rangel, female   DOB: 1934-04-06, 80 y.o.   MRN: [id number]  Diane A. Merlene Laughter, MD     [email or web address]          Diane Rangel is an 80 y.o. female.   Assessment/Plan: 1. Acute multifactorial encephalopathy including acute cerebral infarct, dehydration, acute renal failure, sepsis and UTI. The findings are discussed at length with the family who is at the bedside. The Diane has been placed on aspirin antiplatelet agent. DVT - prophylaxis. She already has had a complete stroke workup which is mostly unrevealing.  2. Acute left frontal ischemic infarct.   The family reports that she is about the same possibly slightly improved regarding movement on the right side and possibly more responsive.    GENERAL: Obese female who is unresponsive.  HEENT: Supple. Atraumatic normocephalic.  ABDOMEN: soft  EXTREMITIES: No edema  BACK: Normal.  SKIN: Normal by inspection.  MENTAL STATUS: She lays in bed with eyes closed. She owns a right selective her up. She does not follow commands. There are no verbal output.  CRANIAL NERVES: Pupils are equal, round and reactive to light and accommodation; extra ocular movements are full, there is no significant nystagmus; upper and lower facial muscles are normal in strength and symmetric, there is no flattening of the nasolabial folds.  MOTOR: Goal weakness throughout likely due to lack of cooperation but there clearly is right-sided hemiparesis. She moves her left side spontaneously.  COORDINATION: There are no dysmetria or tremors noted. No parkinsonism.  SENSATION: She responds to painful some light bilaterally.     Objective: Vital signs in last 24 hours: Temp:  [97.8 F (36.6 C)-99.4 F (37.4 C)] 97.8 F (36.6 C) (04/22 0425) Pulse Rate:  [93-108] 100 (04/22 0425) Resp:  [22-23] 23 (04/22 0425) BP: (101-129)/(53-74) 129/74 mmHg (04/22 0425) SpO2:  [94 %-97 %] 95 % (04/22 0425) Weight:  [121.2 kg (267  lb 3.2 oz)] 121.2 kg (267 lb 3.2 oz) (04/22 0425)  Intake/Output from previous day: 04/21 0701 - 04/22 0700 In: 0  Out: 1225 [Urine:1075; Stool:150] Intake/Output this shift:   Nutritional status: NPO   Lab Results: Results for orders placed during the hospital encounter of 03/26/14 (from the past 48 hour(s))  CULTURE, BLOOD (ROUTINE X 2)     Status: None   Collection Time    03/27/14  9:57 AM      Result Value Ref Range   Specimen Description BLOOD RIGHT ANTECUBITAL     Special Requests       Value: BOTTLES DRAWN AEROBIC AND ANAEROBIC AEB=9CC ANA=11CC   Culture NO GROWTH 1 DAY     Report Status PENDING    CULTURE, BLOOD (ROUTINE X 2)     Status: None   Collection Time    03/27/14 10:03 AM      Result Value Ref Range   Specimen Description BLOOD LEFT HAND     Special Requests BOTTLES DRAWN AEROBIC AND ANAEROBIC 10CC     Culture NO GROWTH 1 DAY     Report Status PENDING    GLUCOSE, CAPILLARY     Status: Abnormal   Collection Time    03/27/14 11:30 AM      Result Value Ref Range   Glucose-Capillary 341 (*) 70 - 99 mg/dL   Comment 1 Documented in Chart     Comment 2 Notify RN    GLUCOSE, CAPILLARY     Status: Abnormal   Collection Time  03/27/14  4:17 PM      Result Value Ref Range   Glucose-Capillary 282 (*) 70 - 99 mg/dL   Comment 1 Documented in Chart     Comment 2 Notify RN    GLUCOSE, CAPILLARY     Status: Abnormal   Collection Time    03/27/14  9:45 PM      Result Value Ref Range   Glucose-Capillary 238 (*) 70 - 99 mg/dL   Comment 1 Documented in Chart     Comment 2 Notify RN    CBC     Status: Abnormal   Collection Time    03/28/14  5:47 AM      Result Value Ref Range   WBC 21.1 (*) 4.0 - 10.5 K/uL   RBC 4.70  3.87 - 5.11 MIL/uL   Hemoglobin 14.0  12.0 - 15.0 g/dL   HCT 41.4  36.0 - 46.0 %   MCV 88.1  78.0 - 100.0 fL   MCH 29.8  26.0 - 34.0 pg   MCHC 33.8  30.0 - 36.0 g/dL   RDW 14.6  11.5 - 15.5 %   Platelets 216  150 - 400 K/uL  BASIC METABOLIC  PANEL     Status: Abnormal   Collection Time    03/28/14  5:47 AM      Result Value Ref Range   Sodium 143  137 - 147 mEq/L   Potassium 4.7  3.7 - 5.3 mEq/L   Chloride 104  96 - 112 mEq/L   CO2 22  19 - 32 mEq/L   Glucose, Bld 230 (*) 70 - 99 mg/dL   BUN 58 (*) 6 - 23 mg/dL   Comment: DELTA CHECK NOTED   Creatinine, Ser 2.55 (*) 0.50 - 1.10 mg/dL   Calcium 8.5  8.4 - 10.5 mg/dL   GFR calc non Af Amer 17 (*) >90 mL/min   GFR calc Af Amer 20 (*) >90 mL/min   Comment: (NOTE)     The eGFR has been calculated using the CKD EPI equation.     This calculation has not been validated in all clinical situations.     eGFR's persistently <90 mL/min signify possible Chronic Kidney     Disease.  GLUCOSE, CAPILLARY     Status: Abnormal   Collection Time    03/28/14  7:30 AM      Result Value Ref Range   Glucose-Capillary 216 (*) 70 - 99 mg/dL  GLUCOSE, CAPILLARY     Status: Abnormal   Collection Time    03/28/14 11:19 AM      Result Value Ref Range   Glucose-Capillary 156 (*) 70 - 99 mg/dL  GLUCOSE, CAPILLARY     Status: Abnormal   Collection Time    03/28/14  4:04 PM      Result Value Ref Range   Glucose-Capillary 155 (*) 70 - 99 mg/dL  GLUCOSE, CAPILLARY     Status: Abnormal   Collection Time    03/28/14  8:28 PM      Result Value Ref Range   Glucose-Capillary 127 (*) 70 - 99 mg/dL   Comment 1 Notify RN    GLUCOSE, CAPILLARY     Status: None   Collection Time    03/29/14 12:13 AM      Result Value Ref Range   Glucose-Capillary 98  70 - 99 mg/dL  GLUCOSE, CAPILLARY     Status: Abnormal   Collection Time    03/29/14  4:23 AM  Result Value Ref Range   Glucose-Capillary 109 (*) 70 - 99 mg/dL   Comment 1 Notify RN    BASIC METABOLIC PANEL     Status: Abnormal   Collection Time    03/29/14  5:42 AM      Result Value Ref Range   Sodium 148 (*) 137 - 147 mEq/L   Potassium 3.7  3.7 - 5.3 mEq/L   Comment: DELTA CHECK NOTED   Chloride 112  96 - 112 mEq/L   CO2 22  19 - 32  mEq/L   Glucose, Bld 115 (*) 70 - 99 mg/dL   BUN 58 (*) 6 - 23 mg/dL   Creatinine, Ser 1.74 (*) 0.50 - 1.10 mg/dL   Calcium 8.2 (*) 8.4 - 10.5 mg/dL   GFR calc non Af Amer 27 (*) >90 mL/min   GFR calc Af Amer 31 (*) >90 mL/min   Comment: (NOTE)     The eGFR has been calculated using the CKD EPI equation.     This calculation has not been validated in all clinical situations.     eGFR's persistently <90 mL/min signify possible Chronic Kidney     Disease.  CBC     Status: Abnormal   Collection Time    03/29/14  5:42 AM      Result Value Ref Range   WBC 14.4 (*) 4.0 - 10.5 K/uL   RBC 4.25  3.87 - 5.11 MIL/uL   Hemoglobin 12.5  12.0 - 15.0 g/dL   HCT 37.9  36.0 - 46.0 %   MCV 89.2  78.0 - 100.0 fL   MCH 29.4  26.0 - 34.0 pg   MCHC 33.0  30.0 - 36.0 g/dL   RDW 15.2  11.5 - 15.5 %   Platelets 207  150 - 400 K/uL  GLUCOSE, CAPILLARY     Status: Abnormal   Collection Time    03/29/14  7:38 AM      Result Value Ref Range   Glucose-Capillary 105 (*) 70 - 99 mg/dL   Comment 1 Notify RN      Lipid Panel  Recent Labs  03/27/14 0626  CHOL 153  TRIG 144  HDL 33*  CHOLHDL 4.6  VLDL 29  LDLCALC 91    Studies/Results: Ct Abdomen Pelvis Wo Contrast  03/27/2014   CLINICAL DATA:  Persistent diarrhea/abdominal pain  EXAM: CT ABDOMEN AND PELVIS WITHOUT CONTRAST  TECHNIQUE: Multidetector CT imaging of the abdomen and pelvis was performed following the standard protocol without intravenous contrast.  COMPARISON:  None.  FINDINGS: Increased density is appreciated within the lung bases right greater than left.  A trace amount of perihepatic ascites is appreciated. The liver is otherwise unremarkable. The gallbladder and gallbladder fossa are unremarkable.  Noncontrast evaluation of the spleen, adrenals, kidneys is unremarkable. There is near complete fatty replacement of the pancreas visualized portions unremarkable.  Within the limitations of a noncontrast CT there is no evidence of enteritis,  diverticulitis nor appendicitis. There is no evidence bowel obstruction. No abdominal or pelvic free fluid, loculated fluid collections, masses nor adenopathy. There is no evidence of abdominal aortic aneurysm.  Within the rectosigmoid colon, there are areas of bowel wall prominence representing either bowel wall edema versus partial decompression. Mild inflammatory change is also appreciated within the surrounding intraperitoneal fat. There is no evidence of free-fluid nor loculated fluid collections. There is no evidence of free air.  A fat containing anterior abdominal wall hernia is appreciated measuring 8 cm in diameter.  There is no evidence of an inguinal hernia.  Severe osteoarthritic changes are appreciated within the left hip. Multilevel spondylosis is appreciated within the spine. There is no aggressive appearing osseous lesions.  IMPRESSION: 1. Findings which may reflect early or mild proctitis. No associated drainable loculated fluid collections are appreciated. 2. Atelectasis versus infiltrates within the lung bases right greater than left. 3. Very small amount of perihepatic ascites likely reactive 4. Fact containing anterior abdominal wall hernia 5. Degenerative changes within the spine, and left hip.   Electronically Signed   By: Margaree Mackintosh M.D.   On: 03/27/2014 15:30   Mr Jodene Nam Head Wo Contrast  03/27/2014   CLINICAL DATA:  Diabetes and hypertension.  Altered mental status.  EXAM: MRI HEAD WITHOUT CONTRAST  MRA HEAD WITHOUT CONTRAST  TECHNIQUE: Multiplanar, multiecho pulse sequences of the brain and surrounding structures were obtained without intravenous contrast. Angiographic images of the head were obtained using MRA technique without contrast.  COMPARISON:  03/26/2014 CT.  No comparison MR.  FINDINGS: Focal web-like stenosis right middle cerebral artery bifurcation.  Moderate to marked narrowing middle cerebral artery branches bilaterally.  Occluded right vertebral artery. Minimal  retrograde flow distal right vertebral artery.  Non visualized posterior inferior cerebellar arteries.  Moderate narrowing anterior inferior cerebellar arteries.  Nonvisualized right superior cerebellar artery with moderate to marked narrowing left superior cerebral artery.  Moderate to marked web-like stenosis basilar tip.  Moderate to marked narrowing posterior cerebral artery mid to distal aspect bilaterally.  No aneurysm noted  IMPRESSION: MRI HEAD:  Acute moderate to large size medial left frontal lobe nonhemorrhagic infarct extending into the parietal lobe in a left anterior cerebral artery distribution.  Remote small basal ganglia and thalamic infarct some of which were partially hemorrhagic.  Small vessel disease type changes.  MRA HEAD:  Prominent intracranial atherosclerotic type changes as detailed above. This includes  abrupt cut off of flow proximal A2 segment left anterior cerebral artery consistent with Diane's acute infarct.   Electronically Signed   By: Chauncey Cruel M.D.   On: 03/27/2014 13:56   Mr Brain Wo Contrast  03/27/2014   CLINICAL DATA:  Diabetes and hypertension.  Altered mental status.  EXAM: MRI HEAD WITHOUT CONTRAST  MRA HEAD WITHOUT CONTRAST  TECHNIQUE: Multiplanar, multiecho pulse sequences of the brain and surrounding structures were obtained without intravenous contrast. Angiographic images of the head were obtained using MRA technique without contrast.  COMPARISON:  03/26/2014 CT.  No comparison MR.  FINDINGS: Focal web-like stenosis right middle cerebral artery bifurcation.  Moderate to marked narrowing middle cerebral artery branches bilaterally.  Occluded right vertebral artery. Minimal retrograde flow distal right vertebral artery.  Non visualized posterior inferior cerebellar arteries.  Moderate narrowing anterior inferior cerebellar arteries.  Nonvisualized right superior cerebellar artery with moderate to marked narrowing left superior cerebral artery.  Moderate to  marked web-like stenosis basilar tip.  Moderate to marked narrowing posterior cerebral artery mid to distal aspect bilaterally.  No aneurysm noted  IMPRESSION: MRI HEAD:  Acute moderate to large size medial left frontal lobe nonhemorrhagic infarct extending into the parietal lobe in a left anterior cerebral artery distribution.  Remote small basal ganglia and thalamic infarct some of which were partially hemorrhagic.  Small vessel disease type changes.  MRA HEAD:  Prominent intracranial atherosclerotic type changes as detailed above. This includes  abrupt cut off of flow proximal A2 segment left anterior cerebral artery consistent with Diane's acute infarct.   Electronically Signed   By:  Chauncey Cruel M.D.   On: 03/27/2014 13:56   US Renal  03/28/2014   CLINICAL DATA:  Worsening renal function, hypertension, diabetes  EXAM: RENAL/URINARY TRACT ULTRASOUND COMPLETE  COMPARISON:  CT abdomen and pelvis 03/27/2014  TECHNIQUE: Sonography of the kidneys and urinary bladder performed. Exam quality degraded secondary to body habitus.  FINDINGS: Right Kidney:  Length: 8.9 cm. Limited visualization. Lower pole partially obscured. Grossly normal cortical echogenicity. Mild cortical thinning. No gross mass or hydronephrosis.  Left Kidney:  Length: 9.1 cm. Limited visualization unable to adequately visualized to exclude mass or obstruction.  Bladder:  Decompressed by Foley catheter, unable to evaluate  IMPRESSION: Extremely limited exam secondary to body habitus.  Grossly unremarkable right kidney.  Inadequate assessment of left kidney an urinary bladder.   Electronically Signed   By: Lavonia Dana M.D.   On: 03/28/2014 14:38   US Carotid Bilateral  03/27/2014   CLINICAL DATA:  Stroke, hypertension, altered mental status  EXAM: BILATERAL CAROTID DUPLEX ULTRASOUND  TECHNIQUE: Pearline Cables scale imaging, color Doppler and duplex ultrasound was performed of bilateral carotid and vertebral arteries in the neck.  COMPARISON:  None.   REVIEW OF SYSTEMS: Quantification of carotid stenosis is based on velocity parameters that correlate the residual internal carotid diameter with NASCET-based stenosis levels, using the diameter of the distal internal carotid lumen as the denominator for stenosis measurement.  The following velocity measurements were obtained:  PEAK SYSTOLIC/END DIASTOLIC  RIGHT  ICA:                     85/16cm/sec  CCA:                     81/1XB/WIO  SYSTOLIC ICA/CCA RATIO:  0.35  DIASTOLIC ICA/CCA RATIO: 5.97  ECA:                     110cm/sec  LEFT  ICA:                     83/10cm/sec  CCA:                     41/63AG/TXM  SYSTOLIC ICA/CCA RATIO:  4.68  DIASTOLIC ICA/CCA RATIO: 1.0  ECA:                     86cm/sec  FINDINGS: RIGHT CAROTID ARTERY: Intimal thickening through the common carotid artery. There circumferential plaque in the carotid bulb. There is eccentric moderate partially calcified plaque in the proximal ICA resulting in at least mild stenosis. Normal waveforms and peak systolic velocities however. Distal ICA mildly tortuous.  RIGHT VERTEBRAL ARTERY:  Normal flow direction and waveform.  LEFT CAROTID ARTERY: Intimal thickening through the mildly tortuous common carotid artery. There is noncalcified plaque in the carotid bulb extending into the proximal ICA without high-grade stenosis. Normal waveforms and color Doppler signal. Distal ICA is tortuous.  LEFT VERTEBRAL ARTERY: Normal flow direction and waveform.  IMPRESSION: 1. Bilateral carotid bifurcation and proximal ICA plaque, resulting in less than 50% diameter stenosis. The exam does not exclude plaque ulceration or embolization. Continued surveillance recommended.   Electronically Signed   By: Arne Cleveland M.D.   On: 03/27/2014 16:53    ECO Left ventricle: The cavity size was normal. Wall thickness was increased in a pattern of mild LVH. Doppler parameters are consistent with abnormal left ventricular relaxation (grade 1 diastolic  dysfunction).  Impressions:  -  Very poor acoustic windows limit study. Cannot fully evaluate RV systolic function. LV systolic function (based on parasternal views and apical 4 chamber view) is normal. Transthoracic echocardiography. M-mode, limited 2D, limited spectral Doppler, and color Doppler. Height: Height: 177.8cm. Height: 70in. Weight: Weight: 113.2kg. Weight: 249lb. Body mass index: BMI: 35.8kg/m^2. Body surface area: BSA: 2.62m2. Blood pressure: 110/60. Diane status: Inpatient. Location: Bedside.   Medications:  Scheduled Meds: . antiseptic oral rinse  15 mL Mouth Rinse q12n4p  . aspirin  300 mg Rectal Daily  . chlorhexidine  15 mL Mouth/Throat BID  . insulin aspart  0-15 Units Subcutaneous TID WC  . insulin detemir  20 Units Subcutaneous Daily  . piperacillin-tazobactam (ZOSYN)  IV  3.375 g Intravenous Q8H  . sodium chloride  3 mL Intravenous Q12H  . [START ON 03/30/2014] vancomycin  1,500 mg Intravenous Q48H   Continuous Infusions: . sodium chloride 150 mL/hr at 03/29/14 0107   PRN Meds:.sodium chloride, ondansetron (ZOFRAN) IV, ondansetron, sodium chloride     LOS: 3 days   Antuan Limes A. DMerlene Rangel M.D.  Diplomate, ATax adviserof Psychiatry and Neurology ( Neurology).

## 2014-03-29 NOTE — Progress Notes (Signed)
[facility] PROGRESS NOTE  TRUE SHACKLEFORD RUE:[id number] DOB: 02-02-1934 DOA: 03/26/2014 PCP: Ernestine Conrad, MD   Summary: 80 year old female with a history of diabetes mellitus, hypertension, and irritable bowel syndrome presented with mental status change and diarrhea. Admitted with AMS presubably related to acute onset of diarrhea without hematochezia or melena. In the ED, the patient had an episode of emesis, but the patient's son stated that she was able to communicate. On the morning of 03/27/2014, the patient became more encephalopathic. MRI/MRA reveals acute cerebral infarct. In addition, the patient has developed acute kidney injury likely due to volume depletion from her profuse diarrhea.   Assessment/Plan: Acute nonhemorhagic cerebral infarct  More alert this morning. Will smile, nod head to questions. Follows simple commands. MRI/MRA confirmed left frontal infarct. Bilateral carotid bifurcation and proximal ICA plaque, resulting in less than 50% diameter stenosis per carotid doppler. 2 decho with very poor acoustic windows limit study. Cannot fully evaluate RV systolic function. LV systolic function (basedon parasternal views and apical 4 chamber view) is normal. lipids with HDL 33 otherwise ENL, HgA1c 6.8. evaluated by neurology who opine stroke workup unrevealing. Hopefully will be able to work with PT soon. Will re-request ST for swallow eval. Continue asa and statin   Sepsis   likely related to UTI In setting of likely dehydration from GI losses. Max temp 98.6 oral. Blood cultures negative to date. Urine culture klebsiella sensitive to zosyn. Atelectasis vs infiltrate bases of lungs r>L on CT. Also concern for aspiration. Will obtain chest xray today as sats dropping slightly. Zosyn day #3, rocephin 03/26/14-4/20; consider discontinuing vanc with no growth on blood cultures. Remains tachycardia but mild with leukocytosis continues to trend downward. Will decrease IV rate to 100/hr.    Right hemiparesis/ Dysphasia  See #1. MRI brain. MRA brain.Echocardiogram Carotid Doppler PT/ST as above  Leukocytosis   trending downward. UTI--urine culture as above. Blood cultures no growth to date. CT abdomen pelvis with mild proctitis and atelectatsis vs infiltrate lung bases. Will check chest xray. Afebrile. Non-toxic. Zosyn day #3.   Diarrhea  -Slowing down significantly. Clostridium difficile PCR negative .GI pathogen panel negative  -CT abdomen pelvis as above. Will monitor stool output. If <150 today will discontinue flexaseal.  Diabetes mellitus  -Hemoglobin A1c 6.8. CBG 98-127. Will decrease lantus to 10units and continue SSI. She remains NPO.    Hypertension   Controlled. SBP range 101-129. Continue to hold home amlodipine, HCTZ, lisinopril.  AKI  Creatinine improved this am. Urine output improved. Likely due to volume depletion. Right arm puffy and oxygen sats down slightly. Will decrease rate to 100/hr. Renal US  Reveals extremely limited exam secondary to body habitus. Grossly unremarkable right kidney.  Inadequate assessment of left kidney an urinary bladder.    Code Status: full Family Communication: sons at bediside Disposition Plan: likely need snf   Consultants: Dr. Gerilyn Pilgrim neurology  Procedures:  none  Antibiotics: Rocephin 03/26/14-03/27/14  Zosyn 03/27/14>>  Vancomycin 03/28/14>>   HPI/Subjective: More alert. Smiles. Denies pain/discomfort.   Objective: Filed Vitals:   03/29/14 0425  BP: 129/74  Pulse: 100  Temp: 97.8 F (36.6 C)  Resp: 23    Intake/Output Summary (Last 24 hours) at 03/29/14 0931 Last data filed at 03/29/14 0430  Gross per 24 hour  Intake      0 ml  Output   1225 ml  Net  -1225 ml   Filed Weights   03/27/14 0058 03/29/14 0425  Weight: 112.991 kg (249 lb 1.6  oz) 121.2 kg (267 lb 3.2 oz)    Exam:   General:  Obese NAD  Cardiovascular: tachycardic but regular, no MGR Trace LE edema  Respiratory: normal effort  slightly shallow with good air movement. No wheeze no rhonchi  Abdomen: obese soft +BS non-tender to palpation  Musculoskeletal: right hand wrist puffy, joint without swelling erythema   Data Reviewed: Basic Metabolic Panel:  Recent Labs Lab 03/26/14 2049 03/27/14 0631 03/28/14 0547 03/29/14 0542  NA 138 139 143 148*  K 4.0 4.7 4.7 3.7  CL 96 98 104 112  CO2 21 22 22 22   GLUCOSE 264* 304* 230* 115*  BUN 24* 32* 58* 58*  CREATININE 0.91 1.30* 2.55* 1.74*  CALCIUM 9.6 8.7 8.5 8.2*   Liver Function Tests:  Recent Labs Lab 03/26/14 2049  AST 34  ALT 19  ALKPHOS 83  BILITOT 0.7  PROT 7.5  ALBUMIN 3.5    Recent Labs Lab 03/26/14 2049  LIPASE 45   No results found for this basename: AMMONIA,  in the last 168 hours CBC:  Recent Labs Lab 03/26/14 2049 03/27/14 0631 03/28/14 0547 03/29/14 0542  WBC 20.5* 24.9* 21.1* 14.4*  NEUTROABS 17.3*  --   --   --   HGB 17.0* 16.0* 14.0 12.5  HCT 49.5* 47.1* 41.4 37.9  MCV 88.1 88.9 88.1 89.2  PLT 279 244 216 207   Cardiac Enzymes:  Recent Labs Lab 03/26/14 2049  TROPONINI <0.30   BNP (last 3 results)  Recent Labs  03/26/14 2049  PROBNP 231.2   CBG:  Recent Labs Lab 03/28/14 1604 03/28/14 2028 03/29/14 0013 03/29/14 0423 03/29/14 0738  GLUCAP 155* 127* 98 109* 105*    Recent Results (from the past 240 hour(s))  URINE CULTURE     Status: None   Collection Time    03/26/14  9:57 PM      Result Value Ref Range Status   Specimen Description URINE, CATHETERIZED   Final   Special Requests NONE   Final   Culture  Setup Time     Final   Value: 03/27/2014 02:30     Performed at [facility]   Colony Count     Final   Value: >=100,000 COLONIES/ML     Performed at [facility]   Culture     Final   Value: KLEBSIELLA PNEUMONIAE     Performed at [facility]   Report Status 03/28/2014 FINAL   Final   Organism ID, Bacteria KLEBSIELLA PNEUMONIAE   Final  CLOSTRIDIUM DIFFICILE BY  PCR     Status: None   Collection Time    03/26/14 10:10 PM      Result Value Ref Range Status   C difficile by pcr NEGATIVE  NEGATIVE Final  CULTURE, BLOOD (ROUTINE X 2)     Status: None   Collection Time    03/27/14  9:57 AM      Result Value Ref Range Status   Specimen Description BLOOD RIGHT ANTECUBITAL   Final   Special Requests     Final   Value: BOTTLES DRAWN AEROBIC AND ANAEROBIC AEB=9CC ANA=11CC   Culture NO GROWTH 1 DAY   Final   Report Status PENDING   Incomplete  CULTURE, BLOOD (ROUTINE X 2)     Status: None   Collection Time    03/27/14 10:03 AM      Result Value Ref Range Status   Specimen Description BLOOD LEFT HAND   Final  Special Requests BOTTLES DRAWN AEROBIC AND ANAEROBIC 10CC   Final   Culture NO GROWTH 1 DAY   Final   Report Status PENDING   Incomplete     Studies: Ct Abdomen Pelvis Wo Contrast  03/27/2014   CLINICAL DATA:  Persistent diarrhea/abdominal pain  EXAM: CT ABDOMEN AND PELVIS WITHOUT CONTRAST  TECHNIQUE: Multidetector CT imaging of the abdomen and pelvis was performed following the standard protocol without intravenous contrast.  COMPARISON:  None.  FINDINGS: Increased density is appreciated within the lung bases right greater than left.  A trace amount of perihepatic ascites is appreciated. The liver is otherwise unremarkable. The gallbladder and gallbladder fossa are unremarkable.  Noncontrast evaluation of the spleen, adrenals, kidneys is unremarkable. There is near complete fatty replacement of the pancreas visualized portions unremarkable.  Within the limitations of a noncontrast CT there is no evidence of enteritis, diverticulitis nor appendicitis. There is no evidence bowel obstruction. No abdominal or pelvic free fluid, loculated fluid collections, masses nor adenopathy. There is no evidence of abdominal aortic aneurysm.  Within the rectosigmoid colon, there are areas of bowel wall prominence representing either bowel wall edema versus partial  decompression. Mild inflammatory change is also appreciated within the surrounding intraperitoneal fat. There is no evidence of free-fluid nor loculated fluid collections. There is no evidence of free air.  A fat containing anterior abdominal wall hernia is appreciated measuring 8 cm in diameter. There is no evidence of an inguinal hernia.  Severe osteoarthritic changes are appreciated within the left hip. Multilevel spondylosis is appreciated within the spine. There is no aggressive appearing osseous lesions.  IMPRESSION: 1. Findings which may reflect early or mild proctitis. No associated drainable loculated fluid collections are appreciated. 2. Atelectasis versus infiltrates within the lung bases right greater than left. 3. Very small amount of perihepatic ascites likely reactive 4. Fact containing anterior abdominal wall hernia 5. Degenerative changes within the spine, and left hip.   Electronically Signed   By: Salome HolmesHector  Cooper M.D.   On: 03/27/2014 15:30   Mr Maxine GlennMra Head Wo Contrast  03/27/2014   CLINICAL DATA:  Diabetes and hypertension.  Altered mental status.  EXAM: MRI HEAD WITHOUT CONTRAST  MRA HEAD WITHOUT CONTRAST  TECHNIQUE: Multiplanar, multiecho pulse sequences of the brain and surrounding structures were obtained without intravenous contrast. Angiographic images of the head were obtained using MRA technique without contrast.  COMPARISON:  03/26/2014 CT.  No comparison MR.  FINDINGS: Focal web-like stenosis right middle cerebral artery bifurcation.  Moderate to marked narrowing middle cerebral artery branches bilaterally.  Occluded right vertebral artery. Minimal retrograde flow distal right vertebral artery.  Non visualized posterior inferior cerebellar arteries.  Moderate narrowing anterior inferior cerebellar arteries.  Nonvisualized right superior cerebellar artery with moderate to marked narrowing left superior cerebral artery.  Moderate to marked web-like stenosis basilar tip.  Moderate to marked  narrowing posterior cerebral artery mid to distal aspect bilaterally.  No aneurysm noted  IMPRESSION: MRI HEAD:  Acute moderate to large size medial left frontal lobe nonhemorrhagic infarct extending into the parietal lobe in a left anterior cerebral artery distribution.  Remote small basal ganglia and thalamic infarct some of which were partially hemorrhagic.  Small vessel disease type changes.  MRA HEAD:  Prominent intracranial atherosclerotic type changes as detailed above. This includes  abrupt cut off of flow proximal A2 segment left anterior cerebral artery consistent with patient's acute infarct.   Electronically Signed   By: Bridgett LarssonSteve  Olson M.D.   On: 03/27/2014  13:56   Mr Brain Wo Contrast  03/27/2014   CLINICAL DATA:  Diabetes and hypertension.  Altered mental status.  EXAM: MRI HEAD WITHOUT CONTRAST  MRA HEAD WITHOUT CONTRAST  TECHNIQUE: Multiplanar, multiecho pulse sequences of the brain and surrounding structures were obtained without intravenous contrast. Angiographic images of the head were obtained using MRA technique without contrast.  COMPARISON:  03/26/2014 CT.  No comparison MR.  FINDINGS: Focal web-like stenosis right middle cerebral artery bifurcation.  Moderate to marked narrowing middle cerebral artery branches bilaterally.  Occluded right vertebral artery. Minimal retrograde flow distal right vertebral artery.  Non visualized posterior inferior cerebellar arteries.  Moderate narrowing anterior inferior cerebellar arteries.  Nonvisualized right superior cerebellar artery with moderate to marked narrowing left superior cerebral artery.  Moderate to marked web-like stenosis basilar tip.  Moderate to marked narrowing posterior cerebral artery mid to distal aspect bilaterally.  No aneurysm noted  IMPRESSION: MRI HEAD:  Acute moderate to large size medial left frontal lobe nonhemorrhagic infarct extending into the parietal lobe in a left anterior cerebral artery distribution.  Remote small basal  ganglia and thalamic infarct some of which were partially hemorrhagic.  Small vessel disease type changes.  MRA HEAD:  Prominent intracranial atherosclerotic type changes as detailed above. This includes  abrupt cut off of flow proximal A2 segment left anterior cerebral artery consistent with patient's acute infarct.   Electronically Signed   By: Bridgett Larsson M.D.   On: 03/27/2014 13:56   US Renal  03/28/2014   CLINICAL DATA:  Worsening renal function, hypertension, diabetes  EXAM: RENAL/URINARY TRACT ULTRASOUND COMPLETE  COMPARISON:  CT abdomen and pelvis 03/27/2014  TECHNIQUE: Sonography of the kidneys and urinary bladder performed. Exam quality degraded secondary to body habitus.  FINDINGS: Right Kidney:  Length: 8.9 cm. Limited visualization. Lower pole partially obscured. Grossly normal cortical echogenicity. Mild cortical thinning. No gross mass or hydronephrosis.  Left Kidney:  Length: 9.1 cm. Limited visualization unable to adequately visualized to exclude mass or obstruction.  Bladder:  Decompressed by Foley catheter, unable to evaluate  IMPRESSION: Extremely limited exam secondary to body habitus.  Grossly unremarkable right kidney.  Inadequate assessment of left kidney an urinary bladder.   Electronically Signed   By: Ulyses Southward M.D.   On: 03/28/2014 14:38   US Carotid Bilateral  03/27/2014   CLINICAL DATA:  Stroke, hypertension, altered mental status  EXAM: BILATERAL CAROTID DUPLEX ULTRASOUND  TECHNIQUE: Wallace Cullens scale imaging, color Doppler and duplex ultrasound was performed of bilateral carotid and vertebral arteries in the neck.  COMPARISON:  None.  REVIEW OF SYSTEMS: Quantification of carotid stenosis is based on velocity parameters that correlate the residual internal carotid diameter with NASCET-based stenosis levels, using the diameter of the distal internal carotid lumen as the denominator for stenosis measurement.  The following velocity measurements were obtained:  PEAK SYSTOLIC/END  DIASTOLIC  RIGHT  ICA:                     85/16cm/sec  CCA:                     75/7cm/sec  SYSTOLIC ICA/CCA RATIO:  1.13  DIASTOLIC ICA/CCA RATIO: 2.26  ECA:                     110cm/sec  LEFT  ICA:                     83/10cm/sec  CCA:                     77/10cm/sec  SYSTOLIC ICA/CCA RATIO:  1.08  DIASTOLIC ICA/CCA RATIO: 1.0  ECA:                     86cm/sec  FINDINGS: RIGHT CAROTID ARTERY: Intimal thickening through the common carotid artery. There circumferential plaque in the carotid bulb. There is eccentric moderate partially calcified plaque in the proximal ICA resulting in at least mild stenosis. Normal waveforms and peak systolic velocities however. Distal ICA mildly tortuous.  RIGHT VERTEBRAL ARTERY:  Normal flow direction and waveform.  LEFT CAROTID ARTERY: Intimal thickening through the mildly tortuous common carotid artery. There is noncalcified plaque in the carotid bulb extending into the proximal ICA without high-grade stenosis. Normal waveforms and color Doppler signal. Distal ICA is tortuous.  LEFT VERTEBRAL ARTERY: Normal flow direction and waveform.  IMPRESSION: 1. Bilateral carotid bifurcation and proximal ICA plaque, resulting in less than 50% diameter stenosis. The exam does not exclude plaque ulceration or embolization. Continued surveillance recommended.   Electronically Signed   By: Oley Balm M.D.   On: 03/27/2014 16:53    Scheduled Meds: . antiseptic oral rinse  15 mL Mouth Rinse q12n4p  . aspirin  300 mg Rectal Daily  . chlorhexidine  15 mL Mouth/Throat BID  . insulin aspart  0-15 Units Subcutaneous TID WC  . [START ON 03/30/2014] insulin detemir  10 Units Subcutaneous Daily  . piperacillin-tazobactam (ZOSYN)  IV  3.375 g Intravenous Q8H  . sodium chloride  3 mL Intravenous Q12H  . [START ON 03/30/2014] vancomycin  1,500 mg Intravenous Q48H   Continuous Infusions: . sodium chloride 150 mL/hr at 03/29/14 0107    Principal Problem:   Sepsis Active Problems:    DIABETES MELLITUS, TYPE II, UNCONTROLLED   Gastroenteritis, acute   UTI (lower urinary tract infection)   Syncope, vasovagal   Gastroenteritis   Acute renal failure   Acute encephalopathy   Acute ischemic stroke    Time spent: 35 minutes    Lesle Chris [facility]  [facility] Pager [phone]. If 7PM-7AM, please contact night-coverage at [email or web address], password [facility] 03/29/2014, 9:31 AM  LOS: 3 days        Patient seen and examined, database reviewed. Agree with note by Toya Smothers, NP. Has a klebsiella UTI and an acute Left frontal infarct. ST to perform MBS in am. Plan to proceed with SNF when medically ready.  Peggye Pitt, MD [facility] Pager: [phone]

## 2014-03-29 NOTE — Clinical Social Work Psychosocial (Signed)
Clinical Social Work Department BRIEF PSYCHOSOCIAL ASSESSMENT 03/29/2014  Patient:  Diane Rangel, Diane Rangel     Account Number:  [id number]     Admit date:  03/26/2014  Clinical Social Worker:  Daiva Huge  Date/Time:  03/29/2014 01:14 PM  Referred by:  Physician  Date Referred:  03/29/2014 Referred for  SNF Placement   Other Referral:   Interview type:  Other - See comment Other interview type:   Spoke with son and granddaughter at bedside    PSYCHOSOCIAL DATA Living Status:  FAMILY Admitted from facility:   Level of care:   Primary support name:  son/POA- Steve, and gdaughter- Candace Primary support relationship to patient:  FAMILY Degree of support available:   good    CURRENT CONCERNS Current Concerns  Post-Acute Placement   Other Concerns:    SOCIAL WORK ASSESSMENT / PLAN Met with patient to discuss possible dc needs-patient residing at  home with granddaughter prior to admit- she now has had a stroke and will need SNF at d/c for rehab- family is open to this and the granddaughter use to work at [employer] which would be their preference. Discussed plans and family stresses this will be short term and then home.   Assessment/plan status:  Other - See comment Other assessment/ plan:   FL2 and PASARR for SNF search   Information/referral to community resources:   SNF list    PATIENT'S/FAMILY'S RESPONSE TO PLAN OF CARE: Family very supportive and involved with patient and are optimistic for rehab and progression. CSW offered support and will proceed with SNF search.       Eduard Clos, MSW, [place]

## 2014-03-29 NOTE — Evaluation (Signed)
Physical Therapy Evaluation Patient Details Name: Diane Rangel MRN: [id number] DOB: 03-30-1934 Today's Date: 03/29/2014   History of Present Illness  80 year old female with a history of diabetes mellitus, hypertension, and irritable bowel syndrome presented with mental status change and diarrhea.  The patient was in her usual state of health up until 6 PM on 03/26/2014. The patient had an acute onset of diarrhea without hematochezia or melena. The patient became weak after her for loose bowel movement, and her family later today around 6:30 PM. When the family came back approximately 10-15 minutes later, the patient was minimally responsive and not speaking. As a result, EMS was activated. In the ED, the patient had an episode of emesis, but the patient's son stated that she was able to communicate. The patient lives with her granddaughter and everyone else in the household is without any diarrheal illness. On the morning of 03/27/2014, the patient became more encephalopathic. MRI: Acute moderate to large size medial left frontal lobe nonhemorrhagic infarct extending into the parietal lobe in a left anterior cerebral artery distribution.  Clinical Impression  Pt is more responsive today and is able to follow command.  Pt able to complete exercises on Lt LE I but Rt needed total assist.  Pt will need be placed at a skilled nursing facility to maximize her functional ability.    Follow Up Recommendations SNF    Equipment Recommendations  None recommended by PT    Recommendations for Other Services OT consult     Precautions / Restrictions Precautions Precautions: Fall Restrictions Weight Bearing Restrictions: No      Mobility  Bed Mobility Overal bed mobility: +2 for physical assistance       Transfers Overall transfer level: Needs assistance        General transfer comment: Pt will need lift equipment for transferring at this time.         Pertinent Vitals/Pain No facial  expression of pain at end of treatment pt smiled.    Home Living Family/patient expects to be discharged to:: Unsure Living Arrangements: Children                    rior Function Level of Independence: Independent with assistive device(s)            Extremity/Trunk Assessment   Upper Extremity Assessment: RUE deficits/detail RUE Deficits / Details: generally flacid         Lower Extremity Assessment: RLE deficits/detail;LLE deficits/detail RLE Deficits / Details: generally flacid LLE Deficits / Details: appears to be 2+/5     Communication   Communication: Expressive difficulties (prior to hospital admission no difficulties )  Cognition Arousal/Alertness: Lethargic Behavior During Therapy: WFL for tasks assessed/performed Overall Cognitive Status: Within Functional Limits for tasks assessed                Exercises General Exercises - Lower Extremity Ankle Circles/Pumps: Both;10 reps (Lt active Rt passive for all exercises.) Short Arc Quad: Both;10 reps Heel Slides: Both;10 reps Hip ABduction/ADduction: Both;10 reps      Assessment/Plan    PT Assessment Patient needs continued PT services  PT Diagnosis Hemiplegia dominant side   PT Problem List Decreased strength;Decreased activity tolerance;Decreased balance;Decreased mobility;Decreased safety awareness;Obesity  PT Treatment Interventions Functional mobility training;Therapeutic exercise;Balance training   PT Goals (Current goals can be found in the Care Plan section) Acute Rehab PT Goals PT Goal Formulation: With family Time For Goal Achievement: 04/03/14 Potential to Achieve Goals: Good  Frequency Min 5X/week           End of Session   Activity Tolerance: Patient limited by lethargy Patient left: in bed;with call bell/phone within reach;with family/visitor present           Time: [id number] PT Time Calculation (min): 30 min   Charges:   PT Evaluation $Initial PT Evaluation  Tier I: 1 Procedure          Bella KennedyCynthia J Russell 03/29/2014, 10:29 AM

## 2014-03-29 NOTE — Clinical Social Work Placement (Signed)
Clinical Social Work Department CLINICAL SOCIAL WORK PLACEMENT NOTE 03/29/2014  Patient:  Diane Rangel,Diane Rangel  Account Number:  [id number] Admit date:  03/26/2014  Clinical Social Worker:  Robin SearingJANET Shirla Hodgkiss, LCSWA  Date/time:  03/29/2014 01:26 PM  Clinical Social Work is seeking post-discharge placement for this patient at the following level of care:   SKILLED NURSING   (*CSW will update this form in Epic as items are completed)   03/29/2014  Patient/family provided with [facility] Department of Clinical Social Work's list of facilities offering this level of care within the geographic area requested by the patient (or if unable, by the patient's family).  03/29/2014  Patient/family informed of their freedom to choose among providers that offer the needed level of care, that participate in Medicare, Medicaid or managed care program needed by the patient, have an available bed and are willing to accept the patient.  03/29/2014  Patient/family informed of [facility]' ownership interest in [facility], as well as of the fact that they are under no obligation to receive care at this facility.  PASARR submitted to EDS on 03/29/2014 PASARR number received from EDS on 03/29/2014  FL2 transmitted to all facilities in geographic area requested by pt/family on  03/29/2014 FL2 transmitted to all facilities within larger geographic area on   Patient informed that his/her managed care company has contracts with or will negotiate with  certain facilities, including the following:     Patient/family informed of bed offers received:   Patient chooses bed at  Physician recommends and patient chooses bed at    Patient to be transferred to  on   Patient to be transferred to facility by   The following physician request were entered in Epic:   Additional Comments:  Reece LevyJanet Yuleidy Rappleye, MSW, [employer] [phone]

## 2014-03-30 ENCOUNTER — Inpatient Hospital Stay (HOSPITAL_COMMUNITY): Payer: Medicare Other

## 2014-03-30 ENCOUNTER — Encounter (HOSPITAL_COMMUNITY): Payer: Self-pay | Admitting: Internal Medicine

## 2014-03-30 DIAGNOSIS — E870 Hyperosmolality and hypernatremia: Secondary | ICD-10-CM | POA: Diagnosis present

## 2014-03-30 LAB — GLUCOSE, CAPILLARY
GLUCOSE-CAPILLARY: 138 mg/dL — AB (ref 70–99)
GLUCOSE-CAPILLARY: 155 mg/dL — AB (ref 70–99)
GLUCOSE-CAPILLARY: 318 mg/dL — AB (ref 70–99)
Glucose-Capillary: 129 mg/dL — ABNORMAL HIGH (ref 70–99)

## 2014-03-30 LAB — BASIC METABOLIC PANEL
BUN: 48 mg/dL — ABNORMAL HIGH (ref 6–23)
CALCIUM: 8.4 mg/dL (ref 8.4–10.5)
CO2: 24 meq/L (ref 19–32)
CREATININE: 1.16 mg/dL — AB (ref 0.50–1.10)
Chloride: 117 mEq/L — ABNORMAL HIGH (ref 96–112)
GFR calc Af Amer: 51 mL/min — ABNORMAL LOW (ref >90)
GFR calc non Af Amer: 44 mL/min — ABNORMAL LOW (ref >90)
Glucose, Bld: 145 mg/dL — ABNORMAL HIGH (ref 70–99)
Potassium: 3.6 mEq/L — ABNORMAL LOW (ref 3.7–5.3)
Sodium: 153 mEq/L — ABNORMAL HIGH (ref 137–147)

## 2014-03-30 LAB — CBC
HCT: 36.7 % (ref 36.0–46.0)
Hemoglobin: 12.2 g/dL (ref 12.0–15.0)
MCH: 29.6 pg (ref 26.0–34.0)
MCHC: 33.2 g/dL (ref 30.0–36.0)
MCV: 89.1 fL (ref 78.0–100.0)
Platelets: 213 10*3/uL (ref 150–400)
RBC: 4.12 MIL/uL (ref 3.87–5.11)
RDW: 15.1 % (ref 11.5–15.5)
WBC: 16 10*3/uL — ABNORMAL HIGH (ref 4.0–10.5)

## 2014-03-30 MED ORDER — INSULIN ASPART 100 UNIT/ML ~~LOC~~ SOLN: 0.0000 [IU] | Freq: Three times a day (TID) | SUBCUTANEOUS | Status: DC

## 2014-03-30 MED ORDER — DEXTROSE 5 % IV SOLN: INTRAVENOUS | Status: DC

## 2014-03-30 MED ORDER — RESOURCE THICKENUP CLEAR PO POWD
ORAL | Status: DC | PRN
  Filled 2014-03-30: qty 125

## 2014-03-30 MED ORDER — VANCOMYCIN HCL 10 G IV SOLR
1500.0000 mg | INTRAVENOUS | Status: DC
  Filled 2014-03-30: qty 1500

## 2014-03-30 MED ORDER — ACETAMINOPHEN 650 MG RE SUPP: 650.0000 mg | Freq: Three times a day (TID) | RECTAL | Status: DC | PRN

## 2014-03-30 NOTE — Progress Notes (Signed)
[facility] PROGRESS NOTE  Diane Rangel ZOX:[id number] DOB: 02-15-1934 DOA: 03/26/2014 PCP: Ernestine ConradBLUTH, KIRK, MD  Summary:  80 year old female with a history of diabetes mellitus, hypertension, and irritable bowel syndrome presented with mental status change and diarrhea. Admitted with AMS presubably related to acute onset of diarrhea without hematochezia or melena. In the ED, the patient had an episode of emesis, but the patient's son stated that she was able to communicate. On the morning of 03/27/2014, the patient became more encephalopathic. MRI/MRA reveals acute cerebral infarct. In addition, the patient has developed acute kidney injury likely due to volume depletion from her profuse diarrhea.   Assessment/Plan: Acute nonhemorhagic cerebral infarct  Somewhat lethargic this afternoon. Opens eyes to slight sternal rub. MRI/MRA confirmed left frontal infarct. Bilateral carotid bifurcation and proximal ICA plaque, resulting in less than 50% diameter stenosis per carotid doppler. 2 decho with very poor acoustic windows limit study. Cannot fully evaluate RV systolic function. LV systolic function (basedon parasternal views and apical 4 chamber view) is normal. lipids with HDL 33 otherwise ENL, HgA1c 6.8. evaluated by neurology who opine stroke workup unrevealing. Await results of MBS.  Continue asa and statin  Sepsis  likely related to UTI In setting of likely dehydration from GI losses. Max temp 99.5 axillary. Blood cultures negative to date. Urine culture klebsiella sensitive to zosyn. Atelectasis vs infiltrate bases of lungs r>L on CT. Also concern for aspiration. Chest xray 03/29/14 with atelectasis and possible early aspiration. sats 97% on 2L.  Zosyn day #4, rocephin 03/26/14-4/20; Leukocytosis trending up today. HR 90's  Right hemiparesis/ Dysphasia  See #1. MRI brain. MRA brain.Echocardiogram Carotid Doppler PT/ST as above  Leukocytosis  Trending up today.  UTI--urine culture as above. Blood  cultures no growth to date. CT abdomen pelvis with mild proctitis and atelectatsis vs infiltrate lung bases. Will check chest xray. Afebrile. Non-toxic. Zosyn day #4.  Diarrhea  Slowing down significantly. Clostridium difficile PCR negative .GI pathogen panel negative  -CT abdomen pelvis as above. Will monitor stool output. If <150 today will discontinue flexaseal.  Diabetes mellitus  -Hemoglobin A1c 6.8. CBG 129-155. Will decrease lantus to 10units and continue SSI. She remains NPO.  Hypertension  Controlled. SBP range 101-129. Continue to hold home amlodipine, HCTZ, lisinopril.  AKI  Creatinine continues to improve. Urine output improved. Likely due to volume depletion. Right arm puffy and oxygen sats down slightly. Renal US Reveals extremely limited exam secondary to body habitus. Grossly unremarkable right kidney. Inadequate assessment of left kidney an urinary bladder.  Hypernatremia Will change IV fluids to D5W. May be related to IV NS in setting of continued GI losses.  Will recheck in am.     Code Status: full Family Communication: son and granddaughter at bedside Disposition Plan: snf   Consultants:  neurology  Procedures:  none  Antibiotics: Rocephin 03/26/14-03/27/14  Zosyn 03/27/14>>  Vancomycin 03/28/14>>    HPI/Subjective: Lying in bed. Opens eyes to mild sternal rub. Does not follow commands.  Objective: Filed Vitals:   03/30/14 0406  BP: 144/73  Pulse: 99  Temp: 99.5 F (37.5 C)  Resp: 20    Intake/Output Summary (Last 24 hours) at 03/30/14 1332 Last data filed at 03/30/14 1154  Gross per 24 hour  Intake      0 ml  Output   2150 ml  Net  -2150 ml   Filed Weights   03/27/14 0058 03/29/14 0425 03/30/14 0406  Weight: 112.991 kg (249 lb 1.6 oz) 121.2 kg (267 lb  3.2 oz) 120.6 kg (265 lb 14 oz)    Exam:   General:  Obese NAD  Cardiovascular: tachycardic and regular. No MGR trace LE edema  Respiratory: normal effort slightly shallow. Fair air  movement no crackles  Abdomen: obese +BS non-tender to palpition  Musculoskeletal: joints without erythema/swelling    Data Reviewed: Basic Metabolic Panel:  Recent Labs Lab 03/26/14 2049 03/27/14 0631 03/28/14 0547 03/29/14 0542 03/30/14 0618  NA 138 139 143 148* 153*  K 4.0 4.7 4.7 3.7 3.6*  CL 96 98 104 112 117*  CO2 21 22 22 22 24   GLUCOSE 264* 304* 230* 115* 145*  BUN 24* 32* 58* 58* 48*  CREATININE 0.91 1.30* 2.55* 1.74* 1.16*  CALCIUM 9.6 8.7 8.5 8.2* 8.4   Liver Function Tests:  Recent Labs Lab 03/26/14 2049  AST 34  ALT 19  ALKPHOS 83  BILITOT 0.7  PROT 7.5  ALBUMIN 3.5    Recent Labs Lab 03/26/14 2049  LIPASE 45   No results found for this basename: AMMONIA,  in the last 168 hours CBC:  Recent Labs Lab 03/26/14 2049 03/27/14 0631 03/28/14 0547 03/29/14 0542 03/30/14 0618  WBC 20.5* 24.9* 21.1* 14.4* 16.0*  NEUTROABS 17.3*  --   --   --   --   HGB 17.0* 16.0* 14.0 12.5 12.2  HCT 49.5* 47.1* 41.4 37.9 36.7  MCV 88.1 88.9 88.1 89.2 89.1  PLT 279 244 216 207 213   Cardiac Enzymes:  Recent Labs Lab 03/26/14 2049  TROPONINI <0.30   BNP (last 3 results)  Recent Labs  03/26/14 2049  PROBNP 231.2   CBG:  Recent Labs Lab 03/29/14 1633 03/29/14 2006 03/30/14 0040 03/30/14 0401 03/30/14 1157  GLUCAP 132* 133* 138* 129* 155*    Recent Results (from the past 240 hour(s))  URINE CULTURE     Status: None   Collection Time    03/26/14  9:57 PM      Result Value Ref Range Status   Specimen Description URINE, CATHETERIZED   Final   Special Requests NONE   Final   Culture  Setup Time     Final   Value: 03/27/2014 02:30     Performed at [facility]   Colony Count     Final   Value: >=100,000 COLONIES/ML     [facility]formed at [facility]   Culture     Final   Value: KLEBSIELLA PNEUMONIAE     Performed at [facility]   Report Status 03/28/2014 FINAL   Final   Organism ID, Bacteria KLEBSIELLA PNEUMONIAE    Final  CLOSTRIDIUM DIFFICILE BY PCR     Status: None   Collection Time    03/26/14 10:10 PM      Result Value Ref Range Status   C difficile by pcr NEGATIVE  NEGATIVE Final  CULTURE, BLOOD (ROUTINE X 2)     Status: None   Collection Time    03/27/14  9:57 AM      Result Value Ref Range Status   Specimen Description BLOOD RIGHT ANTECUBITAL   Final   Special Requests     Final   Value: BOTTLES DRAWN AEROBIC AND ANAEROBIC AEB=9CC ANA=11CC   Culture NO GROWTH 3 DAYS   Final   Report Status PENDING   Incomplete  CULTURE, BLOOD (ROUTINE X 2)     Status: None   Collection Time    03/27/14 10:03 AM      Result Value Ref  Range Status   Specimen Description BLOOD LEFT HAND   Final   Special Requests BOTTLES DRAWN AEROBIC AND ANAEROBIC 10CC   Final   Culture NO GROWTH 3 DAYS   Final   Report Status PENDING   Incomplete     Studies: US Renal  03/28/2014   CLINICAL DATA:  Worsening renal function, hypertension, diabetes  EXAM: RENAL/URINARY TRACT ULTRASOUND COMPLETE  COMPARISON:  CT abdomen and pelvis 03/27/2014  TECHNIQUE: Sonography of the kidneys and urinary bladder performed. Exam quality degraded secondary to body habitus.  FINDINGS: Right Kidney:  Length: 8.9 cm. Limited visualization. Lower pole partially obscured. Grossly normal cortical echogenicity. Mild cortical thinning. No gross mass or hydronephrosis.  Left Kidney:  Length: 9.1 cm. Limited visualization unable to adequately visualized to exclude mass or obstruction.  Bladder:  Decompressed by Foley catheter, unable to evaluate  IMPRESSION: Extremely limited exam secondary to body habitus.  Grossly unremarkable right kidney.  Inadequate assessment of left kidney an urinary bladder.   Electronically Signed   By: Ulyses Southward M.D.   On: 03/28/2014 14:38   Dg Chest Port 1 View  03/29/2014   CLINICAL DATA:  Question aspiration pneumonia.  EXAM: PORTABLE CHEST - 1 VIEW  COMPARISON:  03/26/2014 and 12/19/2012.  FINDINGS: 0943 hr. There are  persistent low lung volumes with slightly increased dependent opacities at both lung bases, likely atelectasis. Early aspiration cannot be excluded. Vascular congestion and cardiomegaly are stable. There is no significant pleural effusion. Telemetry leads overlie the chest.  IMPRESSION: Slightly increased bibasilar opacities likely represent atelectasis given the low lung volumes. However, early aspiration cannot be excluded.   Electronically Signed   By: Roxy Horseman M.D.   On: 03/29/2014 11:14    Scheduled Meds: . antiseptic oral rinse  15 mL Mouth Rinse q12n4p  . aspirin  300 mg Rectal Daily  . chlorhexidine  15 mL Mouth/Throat BID  . insulin aspart  0-15 Units Subcutaneous TID WC  . insulin detemir  10 Units Subcutaneous Daily  . piperacillin-tazobactam (ZOSYN)  IV  3.375 g Intravenous Q8H  . sodium chloride  3 mL Intravenous Q12H  . [START ON 03/31/2014] vancomycin  1,500 mg Intravenous Q24H   Continuous Infusions: . dextrose      Principal Problem:   Sepsis Active Problems:   DIABETES MELLITUS, TYPE II, UNCONTROLLED   Gastroenteritis, acute   UTI (lower urinary tract infection)   Syncope, vasovagal   Gastroenteritis   Acute renal failure   Acute encephalopathy   Acute ischemic stroke   Hypernatremia    Time spent: 35 minutes    [facility]  [facility] Pager [phone]. If 7PM-7AM, please contact night-coverage at [email or web address], password [facility] 03/30/2014, 1:32 PM  LOS: 4 days      Patient seen and examined, database reviewed. Discussed with son at bedside. Agree with above note by Toya Smothers, NP. Passed swallow evaluation on a Dysphagia 1 nectar thick liquid diet. Na increased to 153 today. Started on D5. Will recheck Na in am. Likely transition to SNF over next 1-2 days for rehab post acute CVA. Will continue to follow.  Peggye Pitt, MD [facility] Pager: [phone]

## 2014-03-30 NOTE — Progress Notes (Signed)
Patient ID: Diane Rangel, female   DOB: [birth year], 80 y.o.   MRN: [id number]  [place] A. Merlene Laughter, MD     [email or web address]          Diane Rangel is an 80 y.o. female.   Assessment/Plan: 1. Acute multifactorial encephalopathy including acute cerebral infarct, dehydration, acute renal failure, sepsis and UTI. The findings are discussed at length with the family who is at the bedside. The patient has been placed on aspirin antiplatelet agent. DVT - prophylaxis. Swallowing evaluation/modified barium is scheduled for today. Possible discharge afterwards. We will sign off reconsult as needed. Continue with aspirin 325. 2. Acute left frontal ischemic infarct.   The family reports that she is slightly improved regarding movement on the right side and possibly more responsive.    GENERAL: Obese female who is unresponsive.  HEENT: Supple. Atraumatic normocephalic.  ABDOMEN: soft  EXTREMITIES: No edema  BACK: Normal.  SKIN: Normal by inspection.  MENTAL STATUS: She lays in bed with eyes closed. She does follow commands On the left side but hold up 2 fingers. There are no verbal output.  CRANIAL NERVES: Pupils are equal, round and reactive to light and accommodation; extra ocular movements are full, there is no significant nystagmus; upper and lower facial muscles are normal in strength and symmetric, there is no flattening of the nasolabial folds.  MOTOR: Goal weakness throughout likely due to lack of cooperation but there clearly is right-sided hemiparesis. She moves her left side spontaneously.  COORDINATION: There are no dysmetria or tremors noted. No parkinsonism.  SENSATION: She responds to painful some light bilaterally.     Objective: Vital signs in last 24 hours: Temp:  [97.9 F (36.6 C)-99.5 F (37.5 C)] 99.5 F (37.5 C) (04/23 0406) Pulse Rate:  [97-105] 99 (04/23 0406) Resp:  [20] 20 (04/23 0406) BP: (102-151)/(63-73) 144/73 mmHg (04/23 0406) SpO2:   [95 %-97 %] 96 % (04/23 0406) Weight:  [120.6 kg (265 lb 14 oz)] 120.6 kg (265 lb 14 oz) (04/23 0406)  Intake/Output from previous day: 04/22 0701 - 04/23 0700 In: -  Out: 2185 [Urine:1900; Stool:225] Intake/Output this shift:   Nutritional status: NPO   Lab Results: Results for orders placed during the hospital encounter of 03/26/14 (from the past 48 hour(s))  GLUCOSE, CAPILLARY     Status: Abnormal   Collection Time    03/28/14 11:19 AM      Result Value Ref Range   Glucose-Capillary 156 (*) 70 - 99 mg/dL  GLUCOSE, CAPILLARY     Status: Abnormal   Collection Time    03/28/14  4:04 PM      Result Value Ref Range   Glucose-Capillary 155 (*) 70 - 99 mg/dL  GLUCOSE, CAPILLARY     Status: Abnormal   Collection Time    03/28/14  8:28 PM      Result Value Ref Range   Glucose-Capillary 127 (*) 70 - 99 mg/dL   Comment 1 Notify RN    GLUCOSE, CAPILLARY     Status: None   Collection Time    03/29/14 12:13 AM      Result Value Ref Range   Glucose-Capillary 98  70 - 99 mg/dL  GLUCOSE, CAPILLARY     Status: Abnormal   Collection Time    03/29/14  4:23 AM      Result Value Ref Range   Glucose-Capillary 109 (*) 70 - 99 mg/dL   Comment 1 Notify RN    BASIC METABOLIC  PANEL     Status: Abnormal   Collection Time    03/29/14  5:42 AM      Result Value Ref Range   Sodium 148 (*) 137 - 147 mEq/L   Potassium 3.7  3.7 - 5.3 mEq/L   Comment: DELTA CHECK NOTED   Chloride 112  96 - 112 mEq/L   CO2 22  19 - 32 mEq/L   Glucose, Bld 115 (*) 70 - 99 mg/dL   BUN 58 (*) 6 - 23 mg/dL   Creatinine, Ser 1.74 (*) 0.50 - 1.10 mg/dL   Calcium 8.2 (*) 8.4 - 10.5 mg/dL   GFR calc non Af Amer 27 (*) >90 mL/min   GFR calc Af Amer 31 (*) >90 mL/min   Comment: (NOTE)     The eGFR has been calculated using the CKD EPI equation.     This calculation has not been validated in all clinical situations.     eGFR's persistently <90 mL/min signify possible Chronic Kidney     Disease.  CBC     Status:  Abnormal   Collection Time    03/29/14  5:42 AM      Result Value Ref Range   WBC 14.4 (*) 4.0 - 10.5 K/uL   RBC 4.25  3.87 - 5.11 MIL/uL   Hemoglobin 12.5  12.0 - 15.0 g/dL   HCT 37.9  36.0 - 46.0 %   MCV 89.2  78.0 - 100.0 fL   MCH 29.4  26.0 - 34.0 pg   MCHC 33.0  30.0 - 36.0 g/dL   RDW 15.2  11.5 - 15.5 %   Platelets 207  150 - 400 K/uL  GLUCOSE, CAPILLARY     Status: Abnormal   Collection Time    03/29/14  7:38 AM      Result Value Ref Range   Glucose-Capillary 105 (*) 70 - 99 mg/dL   Comment 1 Notify RN    GLUCOSE, CAPILLARY     Status: Abnormal   Collection Time    03/29/14 11:34 AM      Result Value Ref Range   Glucose-Capillary 123 (*) 70 - 99 mg/dL   Comment 1 Notify RN    GLUCOSE, CAPILLARY     Status: Abnormal   Collection Time    03/29/14  4:33 PM      Result Value Ref Range   Glucose-Capillary 132 (*) 70 - 99 mg/dL   Comment 1 Notify RN    GLUCOSE, CAPILLARY     Status: Abnormal   Collection Time    03/29/14  8:06 PM      Result Value Ref Range   Glucose-Capillary 133 (*) 70 - 99 mg/dL   Comment 1 Notify RN    GLUCOSE, CAPILLARY     Status: Abnormal   Collection Time    03/30/14 12:40 AM      Result Value Ref Range   Glucose-Capillary 138 (*) 70 - 99 mg/dL   Comment 1 Notify RN    GLUCOSE, CAPILLARY     Status: Abnormal   Collection Time    03/30/14  4:01 AM      Result Value Ref Range   Glucose-Capillary 129 (*) 70 - 99 mg/dL   Comment 1 Notify RN    BASIC METABOLIC PANEL     Status: Abnormal   Collection Time    03/30/14  6:18 AM      Result Value Ref Range   Sodium 153 (*) 137 - 147 mEq/L  Potassium 3.6 (*) 3.7 - 5.3 mEq/L   Chloride 117 (*) 96 - 112 mEq/L   CO2 24  19 - 32 mEq/L   Glucose, Bld 145 (*) 70 - 99 mg/dL   BUN 48 (*) 6 - 23 mg/dL   Creatinine, Ser 1.16 (*) 0.50 - 1.10 mg/dL   Calcium 8.4  8.4 - 10.5 mg/dL   GFR calc non Af Amer 44 (*) >90 mL/min   GFR calc Af Amer 51 (*) >90 mL/min   Comment: (NOTE)     The eGFR has been  calculated using the CKD EPI equation.     This calculation has not been validated in all clinical situations.     eGFR's persistently <90 mL/min signify possible Chronic Kidney     Disease.  CBC     Status: Abnormal   Collection Time    03/30/14  6:18 AM      Result Value Ref Range   WBC 16.0 (*) 4.0 - 10.5 K/uL   RBC 4.12  3.87 - 5.11 MIL/uL   Hemoglobin 12.2  12.0 - 15.0 g/dL   HCT 36.7  36.0 - 46.0 %   MCV 89.1  78.0 - 100.0 fL   MCH 29.6  26.0 - 34.0 pg   MCHC 33.2  30.0 - 36.0 g/dL   RDW 15.1  11.5 - 15.5 %   Platelets 213  150 - 400 K/uL    Lipid Panel No results found for this basename: CHOL, TRIG, HDL, CHOLHDL, VLDL, LDLCALC,  in the last 72 hours  Studies/Results: US Renal  03/28/2014   CLINICAL DATA:  Worsening renal function, hypertension, diabetes  EXAM: RENAL/URINARY TRACT ULTRASOUND COMPLETE  COMPARISON:  CT abdomen and pelvis 03/27/2014  TECHNIQUE: Sonography of the kidneys and urinary bladder performed. Exam quality degraded secondary to body habitus.  FINDINGS: Right Kidney:  Length: 8.9 cm. Limited visualization. Lower pole partially obscured. Grossly normal cortical echogenicity. Mild cortical thinning. No gross mass or hydronephrosis.  Left Kidney:  Length: 9.1 cm. Limited visualization unable to adequately visualized to exclude mass or obstruction.  Bladder:  Decompressed by Foley catheter, unable to evaluate  IMPRESSION: Extremely limited exam secondary to body habitus.  Grossly unremarkable right kidney.  Inadequate assessment of left kidney an urinary bladder.   Electronically Signed   By: Lavonia Dana M.D.   On: 03/28/2014 14:38   Dg Chest Port 1 View  03/29/2014   CLINICAL DATA:  Question aspiration pneumonia.  EXAM: PORTABLE CHEST - 1 VIEW  COMPARISON:  03/26/2014 and 12/19/2012.  FINDINGS: 0943 hr. There are persistent low lung volumes with slightly increased dependent opacities at both lung bases, likely atelectasis. Early aspiration cannot be excluded.  Vascular congestion and cardiomegaly are stable. There is no significant pleural effusion. Telemetry leads overlie the chest.  IMPRESSION: Slightly increased bibasilar opacities likely represent atelectasis given the low lung volumes. However, early aspiration cannot be excluded.   Electronically Signed   By: Camie Patience M.D.   On: 03/29/2014 11:14    ECO Left ventricle: The cavity size was normal. Wall thickness was increased in a pattern of mild LVH. Doppler parameters are consistent with abnormal left ventricular relaxation (grade 1 diastolic dysfunction).  Impressions:  - Very poor acoustic windows limit study. Cannot fully evaluate RV systolic function. LV systolic function (based on parasternal views and apical 4 chamber view) is normal. Transthoracic echocardiography. M-mode, limited 2D, limited spectral Doppler, and color Doppler. Height: Height: 177.8cm. Height: 70in. Weight: Weight: 113.2kg. Weight:  249lb. Body mass index: BMI: 35.8kg/m^2. Body surface area: BSA: 2.81m2. Blood pressure: 110/60. Patient status: Inpatient. Location: Bedside.   Medications:  Scheduled Meds: . antiseptic oral rinse  15 mL Mouth Rinse q12n4p  . aspirin  300 mg Rectal Daily  . chlorhexidine  15 mL Mouth/Throat BID  . insulin aspart  0-15 Units Subcutaneous TID WC  . insulin detemir  10 Units Subcutaneous Daily  . piperacillin-tazobactam (ZOSYN)  IV  3.375 g Intravenous Q8H  . sodium chloride  3 mL Intravenous Q12H  . vancomycin  1,500 mg Intravenous Q48H   Continuous Infusions: . dextrose     PRN Meds:.sodium chloride, acetaminophen, morphine injection, ondansetron (ZOFRAN) IV, ondansetron, sodium chloride     LOS: 4 days   Adryana Mogensen A. DMerlene Laughter M.D.  Diplomate, ATax adviserof Psychiatry and Neurology ( Neurology).

## 2014-03-30 NOTE — Progress Notes (Signed)
ANTIBIOTIC CONSULT NOTE  Pharmacy Consult for Zosyn and Vancomycin Indication: sepsis  No Known Allergies  Patient Measurements: Height: 5\' 10"  (177.8 cm) Weight: 265 lb 14 oz (120.6 kg) IBW/kg (Calculated) : 68.5  Vital Signs: Temp: 99.5 F (37.5 C) (04/23 0406) Temp src: Oral (04/23 0406) BP: 144/73 mmHg (04/23 0406) Pulse Rate: 99 (04/23 0406) Intake/Output from previous day: 04/22 0701 - 04/23 0700 In: -  Out: 2185 [Urine:1900; Stool:225] Intake/Output from this shift: Total I/O In: -  Out: 550 [Urine:550]  Labs:  Recent Labs  03/28/14 0547 03/29/14 0542 03/30/14 0618  WBC 21.1* 14.4* 16.0*  HGB 14.0 12.5 12.2  PLT 216 207 213  CREATININE 2.55* 1.74* 1.16*   Estimated Creatinine Clearance: 56.3 ml/min (by C-G formula based on Cr of 1.16). No results found for this basename: VANCOTROUGH, Leodis BinetVANCOPEAK, VANCORANDOM, GENTTROUGH, GENTPEAK, GENTRANDOM, TOBRATROUGH, TOBRAPEAK, TOBRARND, AMIKACINPEAK, AMIKACINTROU, AMIKACIN,  in the last 72 hours   Microbiology: Recent Results (from the past 720 hour(s))  URINE CULTURE     Status: None   Collection Time    03/26/14  9:57 PM      Result Value Ref Range Status   Specimen Description URINE, CATHETERIZED   Final   Special Requests NONE   Final   Culture  Setup Time     Final   Value: 03/27/2014 02:30     Performed at [facility]   Colony Count     Final   Value: >=100,000 COLONIES/ML     Performed at [facility]   Culture     Final   Value: KLEBSIELLA PNEUMONIAE     Performed at [facility]   Report Status 03/28/2014 FINAL   Final   Organism ID, Bacteria KLEBSIELLA PNEUMONIAE   Final  CLOSTRIDIUM DIFFICILE BY PCR     Status: None   Collection Time    03/26/14 10:10 PM      Result Value Ref Range Status   C difficile by pcr NEGATIVE  NEGATIVE Final  CULTURE, BLOOD (ROUTINE X 2)     Status: None   Collection Time    03/27/14  9:57 AM      Result Value Ref Range Status   Specimen  Description BLOOD RIGHT ANTECUBITAL   Final   Special Requests     Final   Value: BOTTLES DRAWN AEROBIC AND ANAEROBIC AEB=9CC ANA=11CC   Culture NO GROWTH 3 DAYS   Final   Report Status PENDING   Incomplete  CULTURE, BLOOD (ROUTINE X 2)     Status: None   Collection Time    03/27/14 10:03 AM      Result Value Ref Range Status   Specimen Description BLOOD LEFT HAND   Final   Special Requests BOTTLES DRAWN AEROBIC AND ANAEROBIC 10CC   Final   Culture NO GROWTH 3 DAYS   Final   Report Status PENDING   Incomplete   Medical History: Past Medical History  Diagnosis Date  . Hypertension   . Diabetes mellitus without complication   . CVA (cerebral infarction)   . UTI (lower urinary tract infection)     klebsiella   Medications:  Scheduled:  . antiseptic oral rinse  15 mL Mouth Rinse q12n4p  . aspirin  300 mg Rectal Daily  . chlorhexidine  15 mL Mouth/Throat BID  . insulin aspart  0-15 Units Subcutaneous TID WC  . insulin detemir  10 Units Subcutaneous Daily  . piperacillin-tazobactam (ZOSYN)  IV  3.375 g Intravenous  Q8H  . sodium chloride  3 mL Intravenous Q12H  . [START ON 03/31/2014] vancomycin  1,500 mg Intravenous Q24H   Assessment: 80yo obese female admitted with acute encephalopathy and suspected sepsis.  Asked to initiate Zosyn per protocol on 4/20. Vancomycin added 4/21.  SCr improving.   Estimated Creatinine Clearance: 56.3 ml/min (by C-G formula based on Cr of 1.16).  obesity/Normalized CrCl dosing, normalized CrCl = 45 ml/min.  Zosyn 4/20 >> Vancomycin 4/21 >>  Goal of Therapy:  Eradicate infection. Vancomycin trough level 15-20  Plan:   Zosyn 3.375gm IV q8h, each dose over 4 hrs  Change Vancomycin 1500mg  IV q 24 hrs  Check trough at steady state  Monitor labs, renal fxn, and cultures  Mady GemmaMichael R Jashawna Reever 03/30/2014,1:08 PM

## 2014-03-30 NOTE — Procedures (Signed)
Objective Swallowing Evaluation: Modified Barium Swallowing Study   Patient Details  Name: Diane Rangel MRN: [id number] Date of Birth: 03/20/1934  Today's Date: 03/30/2014 Time: [id number] SLP Time Calculation (min): 31 min  Past Medical History:  Past Medical History  Diagnosis Date  . Hypertension   . Diabetes mellitus without complication   . CVA (cerebral infarction)   . UTI (lower urinary tract infection)     klebsiella   Past Surgical History:  Past Surgical History  Procedure Laterality Date  . Ankle surgery     HPI:  80 year old female with a history of diabetes mellitus, hypertension, and irritable bowel syndrome presented with mental status change and diarrhea.  The patient was in her usual state of health up until 6 PM on 03/26/2014. The patient had an acute onset of diarrhea without hematochezia or melena. The patient became weak after her for loose bowel movement, and her family later today around 6:30 PM. When the family came back approximately 10-15 minutes later, the patient was minimally responsive and not speaking. As a result, EMS was activated. In the ED, the patient had an episode of emesis, but the patient's son stated that she was able to communicate. The patient lives with her granddaughter and everyone else in the household is without any diarrheal illness. On the morning of 03/27/2014, the patient became more encephalopathic. MRI: Acute moderate to large size medial left frontal lobe nonhemorrhagic infarct extending into the parietal lobe in a left anterior cerebral artery distribution.     Assessment / Plan / Recommendation Clinical Impression  Dysphagia Diagnosis: Mild oral phase dysphagia;Mild pharyngeal phase dysphagia Clinical impression: Pt presents with mild oropharyngeal dysphagia in setting of acute CVA and lethargy characterized by weak lingual manipulation, mild delay in oral transit, piecemeal deglutition, delay in swallow initiation with nectars  (fills valleculae) and thins (spills to pyriforms) resulting in lingual residue with semi-solids and flash penetration of nectars (variable) and thins (consistent). No aspiration was observed. Pt had loosed coughing episodes, but no barium noted in laryngeal vestibule/airway. Recommend initiating D1/puree with nectar thick liquids due to pt lethargy and lack of volitional cough. Prognosis for upgraded textures is good with improved level of alertness. Pt will need assist for all po intake.     Treatment Recommendation  Therapy as outlined in treatment plan below    Diet Recommendation Dysphagia 1 (Puree);Nectar-thick liquid   Liquid Administration via: Cup;Spoon Medication Administration: Crushed with puree Supervision: Full supervision/cueing for compensatory strategies;Staff to assist with self feeding Compensations: Slow rate;Small sips/bites;Check for pocketing Postural Changes and/or Swallow Maneuvers: Out of bed for meals;Seated upright 90 degrees;Upright 30-60 min after meal    Other  Recommendations Oral Care Recommendations: Oral care BID;Staff/trained caregiver to provide oral care Other Recommendations: Order thickener from pharmacy;Clarify dietary restrictions   Follow Up Recommendations  Skilled Nursing facility    Frequency and Duration min 2x/week  2 weeks       SLP Swallow Goals  Pt will demonstrate safe and efficient consumption of D1/puree and nectar-thick liquids with mod assist.   General Date of Onset: 03/26/14 HPI: 80 year old female with a history of diabetes mellitus, hypertension, and irritable bowel syndrome presented with mental status change and diarrhea.  The patient was in her usual state of health up until 6 PM on 03/26/2014. The patient had an acute onset of diarrhea without hematochezia or melena. The patient became weak after her for loose bowel movement, and her family later today around 6:30 PM. When the  family came back approximately 10-15 minutes  later, the patient was minimally responsive and not speaking. As a result, EMS was activated. In the ED, the patient had an episode of emesis, but the patient's son stated that she was able to communicate. The patient lives with her granddaughter and everyone else in the household is without any diarrheal illness. On the morning of 03/27/2014, the patient became more encephalopathic. MRI: Acute moderate to large size medial left frontal lobe nonhemorrhagic infarct extending into the parietal lobe in a left anterior cerebral artery distribution. Type of Study: Modified Barium Swallowing Study Reason for Referral: Objectively evaluate swallowing function Previous Swallow Assessment: BSE: NPO Diet Prior to this Study: NPO Temperature Spikes Noted: No Respiratory Status: Nasal cannula History of Recent Intubation: No Behavior/Cognition: Lethargic;Pleasant mood;Cooperative;Requires cueing Oral Cavity - Dentition: Poor condition;Missing dentition Oral Motor / Sensory Function: Impaired - see Bedside swallow eval Self-Feeding Abilities: Needs assist Patient Positioning: Upright in chair Baseline Vocal Quality: Aphonic Volitional Cough: Cognitively unable to elicit Volitional Swallow: Unable to elicit Anatomy: Within functional limits Pharyngeal Secretions: Not observed secondary MBS    Reason for Referral Objectively evaluate swallowing function   Oral Phase Oral Preparation/Oral Phase Oral Phase: Impaired Oral - Solids Oral - Puree: Lingual/palatal residue Oral - Mechanical Soft: Weak lingual manipulation;Lingual/palatal residue;Piecemeal swallowing;Delayed oral transit   Pharyngeal Phase Pharyngeal Phase Pharyngeal Phase: Impaired Pharyngeal - Nectar Pharyngeal - Nectar Teaspoon: Delayed swallow initiation;Premature spillage to valleculae Pharyngeal - Nectar Cup: Delayed swallow initiation;Premature spillage to valleculae;Penetration/Aspiration during swallow Penetration/Aspiration details  (nectar cup): Material does not enter airway;Material enters airway, remains ABOVE vocal cords then ejected out Pharyngeal - Nectar Straw: Delayed swallow initiation;Premature spillage to valleculae;Premature spillage to pyriform sinuses;Penetration/Aspiration during swallow Penetration/Aspiration details (nectar straw): Material does not enter airway;Material enters airway, remains ABOVE vocal cords then ejected out Pharyngeal - Thin Pharyngeal - Thin Teaspoon: Delayed swallow initiation;Premature spillage to pyriform sinuses;Premature spillage to valleculae;Penetration/Aspiration during swallow Penetration/Aspiration details (thin teaspoon): Material does not enter airway;Material enters airway, remains ABOVE vocal cords then ejected out Pharyngeal - Thin Cup: Premature spillage to valleculae;Delayed swallow initiation;Premature spillage to pyriform sinuses;Penetration/Aspiration during swallow Penetration/Aspiration details (thin cup): Material does not enter airway;Material enters airway, remains ABOVE vocal cords then ejected out Pharyngeal - Solids Pharyngeal - Puree: Within functional limits Pharyngeal - Mechanical Soft: Within functional limits Pharyngeal - Pill: Not tested  Cervical Esophageal Phase    GO    Cervical Esophageal Phase Cervical Esophageal Phase:  (unable to adequately visualize due to increased body habitus)        Thank you,  Havery MorosDabney Sherra Kimmons, CCC-SLP [phone]  Dorene Arabney V Bertie Simien 03/30/2014, 3:08 PM

## 2014-03-30 NOTE — Progress Notes (Signed)
SLP Note  Patient Details Name: Diane Rangel MRN: [id number] DOB: January 04, 1934   MBSS scheduled for 11:15 AM today.         Thank you,  Havery MorosDabney Mariko Nowakowski, CCC-SLP [phone]  Dorene ArDabney V Luc Shammas 03/30/2014, 9:15 AM

## 2014-03-30 NOTE — Clinical Social Work Note (Signed)
Family has selected SNF bed at [facility]- planning for MBS today and possible dc tomorrow- updated SNF and CSW covering tomorrow to f/u with final arrangements.  Reece LevyJanet Kinslei Labine, MSW, Theresia MajorsLCSWA [phone]

## 2014-03-31 ENCOUNTER — Encounter (HOSPITAL_COMMUNITY): Payer: Self-pay | Admitting: Emergency Medicine

## 2014-03-31 ENCOUNTER — Emergency Department (HOSPITAL_COMMUNITY): Payer: Medicare Other

## 2014-03-31 ENCOUNTER — Inpatient Hospital Stay (HOSPITAL_COMMUNITY): Payer: Medicare Other

## 2014-03-31 ENCOUNTER — Inpatient Hospital Stay (HOSPITAL_COMMUNITY)
Admission: EM | Admit: 2014-03-31 | Discharge: 2014-04-06 | Disposition: A | Discharge status: Hospice | Payer: Medicare Other | Source: Home / Self Care | Attending: Internal Medicine | Admitting: Internal Medicine

## 2014-03-31 DIAGNOSIS — E870 Hyperosmolality and hypernatremia: Secondary | ICD-10-CM | POA: Diagnosis present

## 2014-03-31 DIAGNOSIS — G9341 Metabolic encephalopathy: Secondary | ICD-10-CM

## 2014-03-31 DIAGNOSIS — G9340 Encephalopathy, unspecified: Secondary | ICD-10-CM | POA: Diagnosis present

## 2014-03-31 DIAGNOSIS — R339 Retention of urine, unspecified: Secondary | ICD-10-CM | POA: Diagnosis present

## 2014-03-31 DIAGNOSIS — A419 Sepsis, unspecified organism: Secondary | ICD-10-CM

## 2014-03-31 DIAGNOSIS — I6340 Cerebral infarction due to embolism of unspecified cerebral artery: Secondary | ICD-10-CM

## 2014-03-31 DIAGNOSIS — K219 Gastro-esophageal reflux disease without esophagitis: Secondary | ICD-10-CM | POA: Diagnosis present

## 2014-03-31 DIAGNOSIS — Z87440 Personal history of urinary (tract) infections: Secondary | ICD-10-CM

## 2014-03-31 DIAGNOSIS — J189 Pneumonia, unspecified organism: Secondary | ICD-10-CM | POA: Diagnosis present

## 2014-03-31 DIAGNOSIS — E669 Obesity, unspecified: Secondary | ICD-10-CM | POA: Diagnosis present

## 2014-03-31 DIAGNOSIS — N189 Chronic kidney disease, unspecified: Secondary | ICD-10-CM

## 2014-03-31 DIAGNOSIS — I69959 Hemiplegia and hemiparesis following unspecified cerebrovascular disease affecting unspecified side: Secondary | ICD-10-CM

## 2014-03-31 DIAGNOSIS — Z8673 Personal history of transient ischemic attack (TIA), and cerebral infarction without residual deficits: Secondary | ICD-10-CM

## 2014-03-31 DIAGNOSIS — Z7982 Long term (current) use of aspirin: Secondary | ICD-10-CM

## 2014-03-31 DIAGNOSIS — I129 Hypertensive chronic kidney disease with stage 1 through stage 4 chronic kidney disease, or unspecified chronic kidney disease: Secondary | ICD-10-CM

## 2014-03-31 DIAGNOSIS — J219 Acute bronchiolitis, unspecified: Secondary | ICD-10-CM | POA: Diagnosis present

## 2014-03-31 DIAGNOSIS — E119 Type 2 diabetes mellitus without complications: Secondary | ICD-10-CM

## 2014-03-31 DIAGNOSIS — B370 Candidal stomatitis: Secondary | ICD-10-CM | POA: Diagnosis present

## 2014-03-31 DIAGNOSIS — E1165 Type 2 diabetes mellitus with hyperglycemia: Secondary | ICD-10-CM

## 2014-03-31 DIAGNOSIS — IMO0001 Reserved for inherently not codable concepts without codable children: Secondary | ICD-10-CM

## 2014-03-31 DIAGNOSIS — K5289 Other specified noninfective gastroenteritis and colitis: Secondary | ICD-10-CM

## 2014-03-31 DIAGNOSIS — R4701 Aphasia: Secondary | ICD-10-CM

## 2014-03-31 DIAGNOSIS — G936 Cerebral edema: Secondary | ICD-10-CM | POA: Diagnosis present

## 2014-03-31 DIAGNOSIS — Z66 Do not resuscitate: Secondary | ICD-10-CM | POA: Diagnosis present

## 2014-03-31 DIAGNOSIS — Z6838 Body mass index (BMI) 38.0-38.9, adult: Secondary | ICD-10-CM

## 2014-03-31 DIAGNOSIS — N179 Acute kidney failure, unspecified: Secondary | ICD-10-CM | POA: Diagnosis present

## 2014-03-31 DIAGNOSIS — Z515 Encounter for palliative care: Secondary | ICD-10-CM

## 2014-03-31 DIAGNOSIS — I639 Cerebral infarction, unspecified: Secondary | ICD-10-CM | POA: Diagnosis present

## 2014-03-31 DIAGNOSIS — Z794 Long term (current) use of insulin: Secondary | ICD-10-CM

## 2014-03-31 DIAGNOSIS — J218 Acute bronchiolitis due to other specified organisms: Secondary | ICD-10-CM | POA: Diagnosis present

## 2014-03-31 DIAGNOSIS — F411 Generalized anxiety disorder: Secondary | ICD-10-CM | POA: Diagnosis present

## 2014-03-31 DIAGNOSIS — N19 Unspecified kidney failure: Secondary | ICD-10-CM

## 2014-03-31 DIAGNOSIS — N390 Urinary tract infection, site not specified: Secondary | ICD-10-CM

## 2014-03-31 DIAGNOSIS — I10 Essential (primary) hypertension: Secondary | ICD-10-CM | POA: Diagnosis present

## 2014-03-31 HISTORY — DX: Cerebral infarction, unspecified: I63.9

## 2014-03-31 LAB — URINALYSIS, ROUTINE W REFLEX MICROSCOPIC
BILIRUBIN URINE: NEGATIVE
Glucose, UA: 100 mg/dL — AB
Ketones, ur: NEGATIVE mg/dL
Leukocytes, UA: NEGATIVE
Nitrite: NEGATIVE
PROTEIN: NEGATIVE mg/dL
Specific Gravity, Urine: 1.024 (ref 1.005–1.030)
Urobilinogen, UA: 1 mg/dL (ref 0.0–1.0)
pH: 5.5 (ref 5.0–8.0)

## 2014-03-31 LAB — CBC WITH DIFFERENTIAL/PLATELET
Basophils Absolute: 0 10*3/uL (ref 0.0–0.1)
Basophils Relative: 0 % (ref 0–1)
EOS PCT: 1 % (ref 0–5)
Eosinophils Absolute: 0.1 10*3/uL (ref 0.0–0.7)
HCT: 39.1 % (ref 36.0–46.0)
HEMOGLOBIN: 13 g/dL (ref 12.0–15.0)
Lymphocytes Relative: 15 % (ref 12–46)
Lymphs Abs: 1.7 10*3/uL (ref 0.7–4.0)
MCH: 29.8 pg (ref 26.0–34.0)
MCHC: 33.2 g/dL (ref 30.0–36.0)
MCV: 89.7 fL (ref 78.0–100.0)
MONO ABS: 1.5 10*3/uL — AB (ref 0.1–1.0)
MONOS PCT: 13 % — AB (ref 3–12)
Neutro Abs: 8.1 10*3/uL — ABNORMAL HIGH (ref 1.7–7.7)
Neutrophils Relative %: 71 % (ref 43–77)
Platelets: 211 10*3/uL (ref 150–400)
RBC: 4.36 MIL/uL (ref 3.87–5.11)
RDW: 15.4 % (ref 11.5–15.5)
WBC: 11.4 10*3/uL — ABNORMAL HIGH (ref 4.0–10.5)

## 2014-03-31 LAB — CBC
HEMATOCRIT: 39.5 % (ref 36.0–46.0)
Hemoglobin: 12.7 g/dL (ref 12.0–15.0)
MCH: 28.9 pg (ref 26.0–34.0)
MCHC: 32.2 g/dL (ref 30.0–36.0)
MCV: 90 fL (ref 78.0–100.0)
Platelets: 216 10*3/uL (ref 150–400)
RBC: 4.39 MIL/uL (ref 3.87–5.11)
RDW: 15.3 % (ref 11.5–15.5)
WBC: 11 10*3/uL — ABNORMAL HIGH (ref 4.0–10.5)

## 2014-03-31 LAB — I-STAT ARTERIAL BLOOD GAS, ED
Bicarbonate: 25.1 mEq/L — ABNORMAL HIGH (ref 20.0–24.0)
O2 SAT: 97 %
PCO2 ART: 42.7 mmHg (ref 35.0–45.0)
Patient temperature: 98.6
TCO2: 26 mmol/L (ref 0–100)
pH, Arterial: 7.377 (ref 7.350–7.450)
pO2, Arterial: 98 mmHg (ref 80.0–100.0)

## 2014-03-31 LAB — GLUCOSE, CAPILLARY
Glucose-Capillary: 188 mg/dL — ABNORMAL HIGH (ref 70–99)
Glucose-Capillary: 242 mg/dL — ABNORMAL HIGH (ref 70–99)

## 2014-03-31 LAB — BASIC METABOLIC PANEL
BUN: 44 mg/dL — ABNORMAL HIGH (ref 6–23)
BUN: 38 mg/dL — ABNORMAL HIGH (ref 6–23)
CALCIUM: 8.3 mg/dL — AB (ref 8.4–10.5)
CHLORIDE: 117 meq/L — AB (ref 96–112)
CHLORIDE: 119 meq/L — AB (ref 96–112)
CO2: 26 mEq/L (ref 19–32)
CO2: 24 mEq/L (ref 19–32)
Calcium: 8.5 mg/dL (ref 8.4–10.5)
Creatinine, Ser: 1.02 mg/dL (ref 0.50–1.10)
Creatinine, Ser: 0.89 mg/dL (ref 0.50–1.10)
GFR calc Af Amer: 59 mL/min — ABNORMAL LOW (ref >90)
GFR calc Af Amer: 70 mL/min — ABNORMAL LOW (ref >90)
GFR calc non Af Amer: 60 mL/min — ABNORMAL LOW (ref >90)
GFR, EST NON AFRICAN AMERICAN: 51 mL/min — AB (ref >90)
GLUCOSE: 211 mg/dL — AB (ref 70–99)
Glucose, Bld: 224 mg/dL — ABNORMAL HIGH (ref 70–99)
POTASSIUM: 3.7 meq/L (ref 3.7–5.3)
Potassium: 4 mEq/L (ref 3.7–5.3)
SODIUM: 153 meq/L — AB (ref 137–147)
SODIUM: 154 meq/L — AB (ref 137–147)

## 2014-03-31 LAB — URINE MICROSCOPIC-ADD ON

## 2014-03-31 LAB — I-STAT CG4 LACTIC ACID, ED: Lactic Acid, Venous: 1.13 mmol/L (ref 0.5–2.2)

## 2014-03-31 MED ORDER — DEXTROSE 5 % IV SOLN
2.0000 g | Freq: Two times a day (BID) | INTRAVENOUS | Status: DC
  Administered 2014-03-31 – 2014-04-03 (×6): 2 g via INTRAVENOUS
  Filled 2014-03-31 (×6): qty 2

## 2014-03-31 MED ORDER — SODIUM CHLORIDE 0.9 % IV SOLN: INTRAVENOUS | Status: DC

## 2014-03-31 MED ORDER — SODIUM CHLORIDE 0.9 % IV SOLN
2500.0000 mg | INTRAVENOUS | Status: AC
  Administered 2014-04-01: 2500 mg via INTRAVENOUS
  Filled 2014-03-31: qty 2500

## 2014-03-31 MED ORDER — CEFEPIME HCL 1 G IJ SOLR
1.0000 g | Freq: Three times a day (TID) | INTRAMUSCULAR | Status: DC
  Filled 2014-03-31 (×2): qty 1

## 2014-03-31 MED ORDER — LEVOFLOXACIN 750 MG PO TABS: 750.0000 mg | ORAL_TABLET | Freq: Every day | ORAL | Status: DC

## 2014-03-31 MED ORDER — ASPIRIN 300 MG RE SUPP
300.0000 mg | Freq: Every day | RECTAL | Status: DC
  Filled 2014-03-31 (×4): qty 1

## 2014-03-31 MED ORDER — VANCOMYCIN HCL IN DEXTROSE 1-5 GM/200ML-% IV SOLN
1000.0000 mg | Freq: Once | INTRAVENOUS | Status: AC
  Administered 2014-03-31: 1000 mg via INTRAVENOUS
  Filled 2014-03-31: qty 200

## 2014-03-31 MED ORDER — ASPIRIN EC 81 MG PO TBEC: 81.0000 mg | DELAYED_RELEASE_TABLET | Freq: Every day | ORAL | Status: DC

## 2014-03-31 MED ORDER — SODIUM CHLORIDE 0.45 % IV SOLN
INTRAVENOUS | Status: DC
  Administered 2014-03-31: 23:00:00 via INTRAVENOUS

## 2014-03-31 MED ORDER — HEPARIN SODIUM (PORCINE) 5000 UNIT/ML IJ SOLN
5000.0000 [IU] | Freq: Three times a day (TID) | INTRAMUSCULAR | Status: DC
  Administered 2014-04-01 – 2014-04-04 (×11): 5000 [IU] via SUBCUTANEOUS
  Filled 2014-03-31 (×14): qty 1

## 2014-03-31 MED ORDER — ALBUTEROL SULFATE (2.5 MG/3ML) 0.083% IN NEBU
INHALATION_SOLUTION | RESPIRATORY_TRACT | Status: AC
  Administered 2014-03-31: 2.5 mg
  Filled 2014-03-31: qty 3

## 2014-03-31 MED ORDER — VANCOMYCIN HCL 10 G IV SOLR
1250.0000 mg | Freq: Two times a day (BID) | INTRAVENOUS | Status: DC
  Administered 2014-04-01 – 2014-04-03 (×4): 1250 mg via INTRAVENOUS
  Filled 2014-03-31 (×6): qty 1250

## 2014-03-31 MED ORDER — HYDRALAZINE HCL 20 MG/ML IJ SOLN
10.0000 mg | INTRAMUSCULAR | Status: DC | PRN
  Administered 2014-04-01: 10 mg via INTRAVENOUS
  Filled 2014-03-31: qty 1

## 2014-03-31 MED ORDER — IPRATROPIUM-ALBUTEROL 0.5-2.5 (3) MG/3ML IN SOLN
3.0000 mL | Freq: Four times a day (QID) | RESPIRATORY_TRACT | Status: DC
  Administered 2014-04-01: 3 mL via RESPIRATORY_TRACT
  Filled 2014-03-31: qty 3

## 2014-03-31 MED ORDER — LEVOFLOXACIN IN D5W 750 MG/150ML IV SOLN
750.0000 mg | INTRAVENOUS | Status: DC
  Administered 2014-03-31: 750 mg via INTRAVENOUS
  Filled 2014-03-31: qty 150

## 2014-03-31 MED ORDER — SODIUM CHLORIDE 0.9 % IV BOLUS (SEPSIS): 1000.0000 mL | Freq: Once | INTRAVENOUS | Status: DC

## 2014-03-31 MED ORDER — PIPERACILLIN-TAZOBACTAM 3.375 G IVPB 30 MIN: 3.3750 g | Freq: Once | INTRAVENOUS | Status: DC

## 2014-03-31 NOTE — ED Notes (Signed)
Report attempted 

## 2014-03-31 NOTE — ED Notes (Signed)
Patient arrived via [facility] from [facility] with Altered level of consciousness per family. Patient was at [facility] post stroke and was just transferred to [facility] and was there less than an hour when family had her transported her. According to family patient came here due to lack of care at [facility].

## 2014-03-31 NOTE — ED Notes (Signed)
Family at bedside. 

## 2014-03-31 NOTE — Progress Notes (Signed)
Granddaughter called this nurse into room, states patient short of breath. Patient assessed, O2 sats 97% on O2 at 2.5lpm. Patient repositioned. No shortness of breath observed. Will continue to monitor.

## 2014-03-31 NOTE — Progress Notes (Signed)
Patient with orders to be transferred to [facility] for rehab. [facility]. Patient transferred via EMS. Patient stable at time of transfer.

## 2014-03-31 NOTE — Progress Notes (Signed)
CBG was 231, tech took fingerstick with glucometer but results did not transfer over to EPIC.

## 2014-03-31 NOTE — Clinical Social Work Placement (Signed)
     Clinical Social Work Department CLINICAL SOCIAL WORK PLACEMENT NOTE 03/31/2014  Patient:  Diane Rangel,Diane Rangel  Account Number:  [id number] Admit date:  03/26/2014  Clinical Social Worker:  Robin SearingJANET CALDWELL, LCSWA  Date/time:  03/29/2014 01:26 PM  Clinical Social Work is seeking post-discharge placement for this patient at the following level of care:   SKILLED NURSING   (*CSW will update this form in Epic as items are completed)   03/29/2014  [facility]/family provided with [facility] Department of Clinical Social Works list of facilities offering this level of care within the geographic area requested by the patient (or if unable, by the patients family).  03/29/2014  Patient/family informed of their freedom to choose among providers that offer the needed level of care, that participate in Medicare, Medicaid or managed care program needed by the patient, have an available bed and are willing to accept the patient.  03/29/2014  Patient/family informed of [facility] ownership interest in [facility], as well as of the fact that they are under no obligation to receive care at this facility.  PASARR submitted to EDS on 03/29/2014 PASARR number received from EDS on 03/29/2014  FL2 transmitted to all facilities in geographic area requested by pt/family on  03/29/2014 FL2 transmitted to all facilities within larger geographic area on   Patient informed that his/her managed care company has contracts with or will negotiate with  certain facilities, including the following:     Patient/family informed of bed offers received:  03[facility]30[facility]2014 [facility] recommends and patient chooses bed at    [facility]  03/31/2014 [facility]  The following physician request were entered in Epic:   Additional Comments:

## 2014-03-31 NOTE — ED Notes (Signed)
Report called to unit. 

## 2014-03-31 NOTE — ED Provider Notes (Addendum)
CSN: [id number]     Arrival date & time 03/31/14  1752 History   First MD Initiated Contact with Patient 03/31/14 1754     Chief Complaint  Patient presents with  . Altered Mental Status     (Consider location/radiation/quality/duration/timing/severity/associated sxs/prior Treatment) The history is provided by the EMS personnel. The history is limited by the condition of the patient.  Diane Rangel is a 80 y.o. female hx of HTN, DM, CVA, recent UTI here with fever. She was diagnosed with UTI, finished a course of zosyn and started on levaquin. She developed a stroke in the hospital and had R sided weakness. She was started on dysphagia diet and sent to a rehab facility. However, while at rehab facility, she was noted by family to be worse and had fever 101.58F and was more tachypneic. She was also speaking this morning and not speaking now. She was sent back to the hospital for further evaluation.    Level V caveat- AMS from previous stroke   Past Medical History  Diagnosis Date  . Hypertension   . Diabetes mellitus without complication   . CVA (cerebral infarction)   . UTI (lower urinary tract infection)     klebsiella  . Stroke    Past Surgical History  Procedure Laterality Date  . Ankle surgery     History reviewed. No pertinent family history. History  Substance Use Topics  . Smoking status: Never Smoker   . Smokeless tobacco: Not on file  . Alcohol Use: No   OB History   Grav Para Term Preterm Abortions TAB SAB Ect Mult Living                 Review of Systems  Unable to perform ROS: Patient nonverbal      Allergies  Review of patient's allergies indicates no known allergies.  Home Medications   Prior to Admission medications   Medication Sig Start Date End Date Taking? Authorizing Provider  amLODipine (NORVASC) 10 MG tablet Take 10 mg by mouth daily. 03/03/14   Historical Provider, MD  aspirin EC 81 MG tablet Take 1 tablet (81 mg total) by mouth daily.  03/31/14   Henderson Cloud, MD  l-methylfolate-B6-B12 (METANX) 3-35-2 MG TABS Take 1 tablet by mouth daily.    Historical Provider, MD  levofloxacin (LEVAQUIN) 750 MG tablet Take 1 tablet (750 mg total) by mouth daily. For 5 days 03/31/14   Henderson Cloud, MD  NOVOLIN N RELION 100 UNIT/ML injection Inject 30 Units into the skin 2 (two) times daily before a meal.  03/08/14   Historical Provider, MD  polyethylene glycol powder (GLYCOLAX/MIRALAX) powder Take 17 g by mouth daily. 02/24/14   Historical Provider, MD  ranitidine (ZANTAC) 150 MG tablet Take 150 mg by mouth 2 (two) times daily. 01/21/14   Historical Provider, MD   BP 155/74  Pulse 99  Resp 14  SpO2 94% Physical Exam  Nursing note and vitals reviewed. Constitutional:  Chronically ill   HENT:  Head: Normocephalic.  Mouth/Throat: Oropharynx is clear and moist.  Eyes: Conjunctivae and EOM are normal. Pupils are equal, round, and reactive to light.  Neck: Normal range of motion. Neck supple.  Cardiovascular: Normal rate, regular rhythm and normal heart sounds.   Pulmonary/Chest: Effort normal.  Diminished breath sounds bilateral bases   Abdominal: Soft. Bowel sounds are normal. She exhibits no distension. There is no tenderness. There is no rebound.  Musculoskeletal: Normal range of motion. She exhibits  no edema and no tenderness.  Neurological: She is alert.  Nonverbal. Not moving R side (not new, happened after recent stroke)   Skin: Skin is warm and dry.  Psychiatric:  Unable     ED Course  Procedures (including critical care time) Labs Review Labs Reviewed  CBC WITH DIFFERENTIAL - Abnormal; Notable for the following:    WBC 11.4 (*)    Monocytes Relative 13 (*)    Neutro Abs 8.1 (*)    Monocytes Absolute 1.5 (*)    All other components within normal limits  BASIC METABOLIC PANEL - Abnormal; Notable for the following:    Sodium 154 (*)    Chloride 119 (*)    Glucose, Bld 211 (*)    BUN 38 (*)     Calcium 8.3 (*)    GFR calc non Af Amer 60 (*)    GFR calc Af Amer 70 (*)    All other components within normal limits  URINE CULTURE  URINALYSIS, ROUTINE W REFLEX MICROSCOPIC  BLOOD GAS, ARTERIAL  I-STAT CG4 LACTIC ACID, ED    Imaging Review Ct Head Wo Contrast  03/31/2014   CLINICAL DATA:  Altered mental status, recent stroke.  EXAM: CT HEAD WITHOUT CONTRAST  TECHNIQUE: Contiguous axial images were obtained from the base of the skull through the vertex without intravenous contrast.  COMPARISON:  MRI brain dated 03/27/2014.  CT head dated 03/26/2014.  FINDINGS: Evolving left ACA distribution infarct, predominantly involving the left medial frontal lobe.  No evidence of parenchymal hemorrhage or extra-axial fluid collection.  No mass lesion, mass effect, or midline shift.  Subcortical white matter and periventricular small vessel ischemic changes. Intracranial atherosclerosis.  Cerebral volume is within normal limits.  No ventriculomegaly.  The visualized paranasal sinuses are essentially clear. The mastoid air cells are unopacified.  No evidence of calvarial fracture.  IMPRESSION: Evolving left ACA distribution infarct.   Electronically Signed   By: Charline BillsSriyesh  Krishnan M.D.   On: 03/31/2014 20:04   Dg Chest Portable 1 View  03/31/2014   CLINICAL DATA:  Fever common weakness  EXAM: PORTABLE CHEST - 1 VIEW  COMPARISON:  Prior chest x-ray 03/29/2014  FINDINGS: Very low inspiratory volumes with stable bibasilar opacities likely atelectasis or scarring. Unchanged borderline cardiomegaly. Atherosclerotic calcifications again noted in the transverse aorta. No new effusion common pneumothorax, pulmonary edema or focal airspace consolidation. No acute osseous abnormality.  IMPRESSION: No significant interval change in the appearance of the chest. Persistent very low inspiratory volumes with bibasilar atelectasis.   Electronically Signed   By: Malachy MoanHeath  McCullough M.D.   On: 03/31/2014 18:38   Dg Swallowing  Func-speech Pathology  03/30/2014   Dorene ArDabney V Porter, CCC-SLP     03/30/2014  3:09 PM Objective Swallowing Evaluation: Modified Barium Swallowing Study    Patient Details  Name: Diane Catholicnez P Buhrman MRN: [id number] Date of Birth: January 30, 1934  Today's Date: 03/30/2014 Time: [id number] SLP Time Calculation (min): 31 min  Past Medical History:  Past Medical History  Diagnosis Date  . Hypertension   . Diabetes mellitus without complication   . CVA (cerebral infarction)   . UTI (lower urinary tract infection)     klebsiella   Past Surgical History:  Past Surgical History  Procedure Laterality Date  . Ankle surgery     HPI:  80 year old female with a history of diabetes mellitus,  hypertension, and irritable bowel syndrome presented with mental  status change and diarrhea.  The patient was in her usual state  of health up until 6 PM on 03/26/2014. The patient had an acute  onset of diarrhea without hematochezia or melena. The patient  became weak after her for loose bowel movement, and her family  later today around 6:30 PM. When the family came back  approximately 10-15 minutes later, the patient was minimally  responsive and not speaking. As a result, EMS was activated. In  the ED, the patient had an episode of emesis, but the patient's  son stated that she was able to communicate. The patient lives  with her granddaughter and everyone else in the household is  without any diarrheal illness. On the morning of 03/27/2014, the  patient became more encephalopathic. 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     Assessment / Plan / Recommendation Clinical Impression  Dysphagia Diagnosis: Mild oral phase dysphagia;Mild pharyngeal  phase dysphagia Clinical impression: Pt presents with mild oropharyngeal  dysphagia in setting of acute CVA and lethargy characterized by  weak lingual manipulation, mild delay in oral  transit, piecemeal  deglutition, delay in swallow initiation with nectars (fills  valleculae) and thins (spills to pyriforms) resulting in lingual  residue with semi-solids and flash penetration of nectars  (variable) and thins (consistent). No aspiration was observed. Pt  had loosed coughing episodes, but no barium noted in laryngeal  vestibule/airway. Recommend initiating D1/puree with nectar thick  liquids due to pt lethargy and lack of volitional cough.  Prognosis for upgraded textures is good with improved level of  alertness. Pt will need assist for all po intake.     Treatment Recommendation  Therapy as outlined in treatment plan below    Diet Recommendation Dysphagia 1 (Puree);Nectar-thick liquid   Liquid Administration via: Cup;Spoon Medication Administration: Crushed with puree Supervision: Full supervision/cueing for compensatory  strategies;Staff to assist with self feeding Compensations: Slow rate;Small sips/bites;Check for pocketing Postural Changes and/or Swallow Maneuvers: Out of bed for  meals;Seated upright 90 degrees;Upright 30-60 min after meal    Other  Recommendations Oral Care Recommendations: Oral care  BID;Staff/trained caregiver to provide oral care Other Recommendations: Order thickener from pharmacy;Clarify  dietary restrictions   Follow Up Recommendations  Skilled Nursing facility    Frequency and Duration min 2x/week  2 weeks       SLP Swallow Goals  Pt will demonstrate safe and efficient consumption of D1/puree  and nectar-thick liquids with mod assist.   General Date of Onset: 03/26/14 HPI: 80 year old female with a history of diabetes mellitus,  hypertension, and irritable bowel syndrome presented with mental  status change and diarrhea.  The patient was in her usual state  of health up until 6 PM on 03/26/2014. The patient had an acute  onset of diarrhea without hematochezia or melena. The patient  became weak after her for loose bowel movement, and her family  later today around  6:30 PM. When the family came back  approximately 10-15 minutes later, the patient was minimally  responsive and not speaking. As a result, EMS was activated. In  the ED, the patient had an episode of emesis, but the patient's  son stated that she was able to communicate. The patient lives  with her granddaughter and everyone else in the household is  without any diarrheal illness. On the morning of 03/27/2014, the  patient became more encephalopathic. MRI: Acute moderate to large  size medial left frontal lobe nonhemorrhagic  infarct extending  into the parietal lobe in a left anterior cerebral artery  distribution. Type of Study: Modified Barium Swallowing Study Reason for Referral: Objectively evaluate swallowing function Previous Swallow Assessment: BSE: NPO Diet Prior to this Study: NPO Temperature Spikes Noted: No Respiratory Status: Nasal cannula History of Recent Intubation: No Behavior/Cognition: Lethargic;Pleasant mood;Cooperative;Requires  cueing Oral Cavity - Dentition: Poor condition;Missing dentition Oral Motor / Sensory Function: Impaired - see Bedside swallow  eval Self-Feeding Abilities: Needs assist Patient Positioning: Upright in chair Baseline Vocal Quality: Aphonic Volitional Cough: Cognitively unable to elicit Volitional Swallow: Unable to elicit Anatomy: Within functional limits Pharyngeal Secretions: Not observed secondary MBS    Reason for Referral Objectively evaluate swallowing function   Oral Phase Oral Preparation/Oral Phase Oral Phase: Impaired Oral - Solids Oral - Puree: Lingual/palatal residue Oral - Mechanical Soft: Weak lingual manipulation;Lingual/palatal  residue;Piecemeal swallowing;Delayed oral transit   Pharyngeal Phase Pharyngeal Phase Pharyngeal Phase: Impaired Pharyngeal - Nectar Pharyngeal - Nectar Teaspoon: Delayed swallow  initiation;Premature spillage to valleculae Pharyngeal - Nectar Cup: Delayed swallow initiation;Premature  spillage to valleculae;Penetration/Aspiration  during swallow Penetration/Aspiration details (nectar cup): Material does not  enter airway;Material enters airway, remains ABOVE vocal cords  then ejected out Pharyngeal - Nectar Straw: Delayed swallow initiation;Premature  spillage to valleculae;Premature spillage to pyriform  sinuses;Penetration/Aspiration during swallow Penetration/Aspiration details (nectar straw): Material does not  enter airway;Material enters airway, remains ABOVE vocal cords  then ejected out Pharyngeal - Thin Pharyngeal - Thin Teaspoon: Delayed swallow initiation;Premature  spillage to pyriform sinuses;Premature spillage to  valleculae;Penetration/Aspiration during swallow Penetration/Aspiration details (thin teaspoon): Material does not  enter airway;Material enters airway, remains ABOVE vocal cords  then ejected out Pharyngeal - Thin Cup: Premature spillage to valleculae;Delayed  swallow initiation;Premature spillage to pyriform  sinuses;Penetration/Aspiration during swallow Penetration/Aspiration details (thin cup): Material does not  enter airway;Material enters airway, remains ABOVE vocal cords  then ejected out Pharyngeal - Solids Pharyngeal - Puree: Within functional limits Pharyngeal - Mechanical Soft: Within functional limits Pharyngeal - Pill: Not tested  Cervical Esophageal Phase    GO    Cervical Esophageal Phase Cervical Esophageal Phase:  (unable to adequately visualize due  to increased body habitus)        Thank you,  Havery Moros, CCC-SLP [phone]  Dorene Ar 03/30/2014, 3:08 PM      EKG Interpretation   Date/Time:  Friday March 31 2014 18:27:46 EDT Ventricular Rate:  104 PR Interval:  151 QRS Duration: 100 QT Interval:  353 QTC Calculation: 464 R Axis:   33 Text Interpretation:  Sinus tachycardia Atrial premature complex Consider  anterior infarct No significant change since last tracing Confirmed by YAO   MD, DAVID ([id number]) on 03/31/2014 6:35:28 PM      MDM   Final diagnoses:   Healthcare-associated pneumonia  Renal failure  Sepsis   Diane Rangel is a 80 y.o. female here with fever. Given recent UTI and stroke, will repeat sepsis workup.   6:20 PM i called Dr. Ardyth Harps, who admitted patient at [facility], who states that she may have aspirated given recent stroke. She was recently started on a diet.   8:11 PM More hypernatremic. Given IVF. WBC stable. CXR stable. Given increased oxygen requirement, possible aspiration, will given vanc/cefepime for possible HCAP vs aspiration pneumonia. CT showed evolving infarct. Will readmit.      Richardean Canal, MD 03/31/14 1929  Richardean Canal, MD 03/31/14 2011

## 2014-03-31 NOTE — ED Notes (Signed)
Dr. Patel at bedside 

## 2014-03-31 NOTE — ED Notes (Signed)
Patient has a flexiseal in place. EDP aware, unable to obtain rectal temp.

## 2014-03-31 NOTE — Progress Notes (Signed)
ANTIBIOTIC CONSULT NOTE - INITIAL  Pharmacy Consult for Levaquin Indication: pneumonia  No Known Allergies  Patient Measurements: Height: 5\' 10"  (177.8 cm) Weight: 265 lb 7.8 oz (120.424 kg) IBW/kg (Calculated) : 68.5  Vital Signs: Temp: 98.6 F (37 C) (04/24 0423) Temp src: Oral (04/24 0423) BP: 177/77 mmHg (04/24 0423) Pulse Rate: 95 (04/24 0423) Intake/Output from previous day: 04/23 0701 - 04/24 0700 In: -  Out: 1400 [Urine:1300; Stool:100] Intake/Output from this shift:    Labs:  Recent Labs  03/29/14 0542 03/30/14 0618 03/31/14 0556  WBC 14.4* 16.0* 11.0*  HGB 12.5 12.2 12.7  PLT 207 213 216  CREATININE 1.74* 1.16* 1.02   Estimated Creatinine Clearance: 64.1 ml/min (by C-G formula based on Cr of 1.02). No results found for this basename: VANCOTROUGH, Leodis BinetVANCOPEAK, VANCORANDOM, GENTTROUGH, GENTPEAK, GENTRANDOM, TOBRATROUGH, TOBRAPEAK, TOBRARND, AMIKACINPEAK, AMIKACINTROU, AMIKACIN,  in the last 72 hours   Microbiology: Recent Results (from the past 720 hour(s))  URINE CULTURE     Status: None   Collection Time    03/26/14  9:57 PM      Result Value Ref Range Status   Specimen Description URINE, CATHETERIZED   Final   Special Requests NONE   Final   Culture  Setup Time     Final   Value: 03/27/2014 02:30     [facility]formed at [facility]   Colony Count     Final   Value: >=100,000 COLONIES/ML     [facility]formed at [facility]   Culture     Final   Value: KLEBSIELLA PNEUMONIAE     Performed at [facility]   Report Status 03/28/2014 FINAL   Final   Organism ID, Bacteria KLEBSIELLA PNEUMONIAE   Final  CLOSTRIDIUM DIFFICILE BY PCR     Status: None   Collection Time    03/26/14 10:10 PM      Result Value Ref Range Status   C difficile by pcr NEGATIVE  NEGATIVE Final  CULTURE, BLOOD (ROUTINE X 2)     Status: None   Collection Time    03/27/14  9:57 AM      Result Value Ref Range Status   Specimen Description BLOOD RIGHT ANTECUBITAL    Final   Special Requests     Final   Value: BOTTLES DRAWN AEROBIC AND ANAEROBIC AEB=9CC ANA=11CC   Culture NO GROWTH 3 DAYS   Final   Report Status PENDING   Incomplete  CULTURE, BLOOD (ROUTINE X 2)     Status: None   Collection Time    03/27/14 10:03 AM      Result Value Ref Range Status   Specimen Description BLOOD LEFT HAND   Final   Special Requests BOTTLES DRAWN AEROBIC AND ANAEROBIC 10CC   Final   Culture NO GROWTH 3 DAYS   Final   Report Status PENDING   Incomplete   Medical History: Past Medical History  Diagnosis Date  . Hypertension   . Diabetes mellitus without complication   . CVA (cerebral infarction)   . UTI (lower urinary tract infection)     klebsiella   Medications:  Scheduled:  . antiseptic oral rinse  15 mL Mouth Rinse q12n4p  . aspirin  300 mg Rectal Daily  . chlorhexidine  15 mL Mouth/Throat BID  . insulin aspart  0-15 Units Subcutaneous TID WC  . insulin detemir  10 Units Subcutaneous Daily  . levofloxacin (LEVAQUIN) IV  750 mg Intravenous Q24H  . sodium chloride  3  mL Intravenous Q12H   Assessment: 80yo obese female with multiple medical issues.  Asked to initiate Levaquin for CAP.  Pt has good renal fxn.  Estimated Creatinine Clearance: 64.1 ml/min (by C-G formula based on Cr of 1.02).  Levaquin 4/24 >>  Goal of Therapy:  Eradicate infection.  Plan:  Levaquin 750mg  IV q24hrs Switch to PO when improved Monitor labs, renal fxn, and cultures  Shariah Assad A Nandana Krolikowski 03/31/2014,8:26 AM

## 2014-03-31 NOTE — Clinical Social Work Note (Signed)
Patient ready for discharge today,will transfer to [facility] by [facility].  Daughter in room aware and agreeable, patient minimally oriented and has difficulty speaking.  [facility] aware and agreeable.  Discharge summary faxed to facility via [facility].  FL2 reviewed w RN and updated as needed.  Discharge packet prepared and placed w shadow chart for transport.  CSW signing off as no further SW needs identified.  Santa GeneraAnne Cunningham, LCSW Clinical Social Worker [phone])

## 2014-03-31 NOTE — H&P (Signed)
[facility] History and Physical  Patient: Diane Rangel  ZOX:[id number]  DOB: 03-Nov-1934  DOS: the patient was seen and examined on 03/31/2014 PCP: Ernestine Conrad, MD  Chief Complaint:  fever  HPI: Diane Rangel is a 80 y.o. female with Past medical history of hypertension, diabetes, recent left ACA stroke, dysphagia. The patient was brought in from the nursing home. She was recently admitted to the hospital for acute left ACA stroke. She was also found to have significant diarrhea for which workup was done and it was negative. She was sent to the nursing home with her rectal tube. During the hospital she was also found to have possible sepsis due to UTI and she was treated with broad-spectrum antibiotic and later on was narrowed down to Levaquin. She had a Foley catheter which was removed before the discharge. Speech therapy was consulted and patient was started on oral diet with dysphagia monitoring.  After discharge from the hospital, when she reached the nursing home, she started having increasing shortness of breath associated with fever of 101 and increasing lethargy. Patient was immediately brought back to [facility] [place] for further workup. Patient was initially lethargic but at the time of my evaluation the patient was on sitting questions saying"okay"and was more conversant in as per the family. No other complaints noted by family after the discharge.  The patient is coming from SNF. And at her baseline dependent for most of her ADL.  Review of Systems: as mentioned in the history of present illness.  A Comprehensive review of the other systems is negative.  Past Medical History  Diagnosis Date  . Hypertension   . Diabetes mellitus without complication   . CVA (cerebral infarction)   . UTI (lower urinary tract infection)     klebsiella  . Stroke    Past Surgical History  Procedure Laterality Date  . Ankle surgery     Social History:  reports that she has never smoked.  She does not have any smokeless tobacco history on file. She reports that she does not drink alcohol or use illicit drugs.  No Known Allergies  History reviewed. No pertinent family history.  Prior to Admission medications   Medication Sig Start Date End Date Taking? Authorizing Provider  amLODipine (NORVASC) 10 MG tablet Take 10 mg by mouth daily. 03/03/14  Yes Historical Provider, MD  aspirin EC 81 MG tablet Take 1 tablet (81 mg total) by mouth daily. 03/31/14  Yes Estela Isaiah Blakes, MD  l-methylfolate-B6-B12 (METANX) 3-35-2 MG TABS Take 1 tablet by mouth daily.   Yes Historical Provider, MD  NOVOLIN N RELION 100 UNIT/ML injection Inject 30 Units into the skin 2 (two) times daily before a meal.  03/08/14  Yes Historical Provider, MD  polyethylene glycol powder (GLYCOLAX/MIRALAX) powder Take 17 g by mouth daily. 02/24/14  Yes Historical Provider, MD  ranitidine (ZANTAC) 150 MG tablet Take 150 mg by mouth 2 (two) times daily. 01/21/14  Yes Historical Provider, MD  levofloxacin (LEVAQUIN) 750 MG tablet Take 1 tablet (750 mg total) by mouth daily. For 5 days 03/31/14   Henderson Cloud, MD    Physical Exam: Filed Vitals:   03/31/14 2115 03/31/14 2145 03/31/14 2200 03/31/14 2215  BP: 154/75 161/69 168/74 161/73  Pulse: 104 102 103 102  Temp:      TempSrc:      Resp: 26 25 26 25   SpO2: 88% 97% 95% 97%    General: Alert, Awake and verbalizing  Okay to all questions . Appear in moderate distress Eyes: PERRL EOM present, ENT: Oral Mucosa clear dry. Neck: No  JVD Cardiovascular: S1 and S2 Present, no  Murmur, Peripheral Pulses Present Respiratory: Bilateral Air entry equal and Decreased, faint basal  Crackles,bilateral expiratory  wheezes Abdomen: Bowel Sound Present, Soft and Non tender Skin: No  Rash Extremities: Bilateral  Pedal edema, no  calf tenderness Neurologic: Unchanged right-sided weakness and sensory loss, left gaze preference   Labs on Admission:  CBC:  Recent  Labs Lab 03/26/14 2049  03/28/14 0547 03/29/14 0542 03/30/14 0618 03/31/14 0556 03/31/14 1809  WBC 20.5*  < > 21.1* 14.4* 16.0* 11.0* 11.4*  NEUTROABS 17.3*  --   --   --   --   --  8.1*  HGB 17.0*  < > 14.0 12.5 12.2 12.7 13.0  HCT 49.5*  < > 41.4 37.9 36.7 39.5 39.1  MCV 88.1  < > 88.1 89.2 89.1 90.0 89.7  PLT 279  < > 216 207 213 216 211  < > = values in this interval not displayed.  CMP     Component Value Date/Time   NA 154* 03/31/2014 1809   K 4.0 03/31/2014 1809   CL 119* 03/31/2014 1809   CO2 24 03/31/2014 1809   GLUCOSE 211* 03/31/2014 1809   BUN 38* 03/31/2014 1809   CREATININE 0.89 03/31/2014 1809   CALCIUM 8.3* 03/31/2014 1809   PROT 7.5 03/26/2014 2049   ALBUMIN 3.5 03/26/2014 2049   AST 34 03/26/2014 2049   ALT 19 03/26/2014 2049   ALKPHOS 83 03/26/2014 2049   BILITOT 0.7 03/26/2014 2049   GFRNONAA 60* 03/31/2014 1809   GFRAA 70* 03/31/2014 1809     Recent Labs Lab 03/26/14 2049  LIPASE 45   No results found for this basename: AMMONIA,  in the last 168 hours   Recent Labs Lab 03/26/14 2049  TROPONINI <0.30   BNP (last 3 results)  Recent Labs  03/26/14 2049  PROBNP 231.2    Radiological Exams on Admission: Ct Head Wo Contrast  03/31/2014   CLINICAL DATA:  Altered mental status, recent stroke.  EXAM: CT HEAD WITHOUT CONTRAST  TECHNIQUE: Contiguous axial images were obtained from the base of the skull through the vertex without intravenous contrast.  COMPARISON:  MRI brain dated 03/27/2014.  CT head dated 03/26/2014.  FINDINGS: Evolving left ACA distribution infarct, predominantly involving the left medial frontal lobe.  No evidence of parenchymal hemorrhage or extra-axial fluid collection.  No mass lesion, mass effect, or midline shift.  Subcortical white matter and periventricular small vessel ischemic changes. Intracranial atherosclerosis.  Cerebral volume is within normal limits.  No ventriculomegaly.  The visualized paranasal sinuses are essentially clear.  The mastoid air cells are unopacified.  No evidence of calvarial fracture.  IMPRESSION: Evolving left ACA distribution infarct.   Electronically Signed   By: Charline BillsSriyesh  Krishnan M.D.   On: 03/31/2014 20:04   Dg Chest Portable 1 View  03/31/2014   CLINICAL DATA:  Fever common weakness  EXAM: PORTABLE CHEST - 1 VIEW  COMPARISON:  Prior chest x-ray 03/29/2014  FINDINGS: Very low inspiratory volumes with stable bibasilar opacities likely atelectasis or scarring. Unchanged borderline cardiomegaly. Atherosclerotic calcifications again noted in the transverse aorta. No new effusion common pneumothorax, pulmonary edema or focal airspace consolidation. No acute osseous abnormality.  IMPRESSION: No significant interval change in the appearance of the chest. Persistent very low inspiratory volumes with bibasilar atelectasis.   Electronically Signed  By: Malachy MoanHeath  McCullough M.D.   On: 03/31/2014 18:38   Dg Swallowing Func-speech Pathology  03/30/2014   Dorene ArDabney V Porter, CCC-SLP     03/30/2014  3:09 PM Objective Swallowing Evaluation: Modified Barium Swallowing Study    Patient Details  Name: Diane Rangel MRN: [id number] Date of Birth: 1934-03-21  Today's Date: 03/30/2014 Time: [id number] SLP Time Calculation (min): 31 min  Past Medical History:  Past Medical History  Diagnosis Date  . Hypertension   . Diabetes mellitus without complication   . CVA (cerebral infarction)   . UTI (lower urinary tract infection)     klebsiella   Past Surgical History:  Past Surgical History  Procedure Laterality Date  . Ankle surgery     HPI:  80 year old female with a history of diabetes mellitus,  hypertension, and irritable bowel syndrome presented with mental  status change and diarrhea.  The patient was in her usual state  of health up until 6 PM on 03/26/2014. The patient had an acute  onset of diarrhea without hematochezia or melena. The patient  became weak after her for loose bowel movement, and her family  later today around 6:30 PM. When  the family came back  approximately 10-15 minutes later, the patient was minimally  responsive and not speaking. As a result, EMS was activated. In  the ED, the patient had an episode of emesis, but the patient's  son stated that she was able to communicate. The patient lives  with her granddaughter and everyone else in the household is  without any diarrheal illness. On the morning of 03/27/2014, the  patient became more encephalopathic. 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     Assessment / Plan / Recommendation Clinical Impression  Dysphagia Diagnosis: Mild oral phase dysphagia;Mild pharyngeal  phase dysphagia Clinical impression: Pt presents with mild oropharyngeal  dysphagia in setting of acute CVA and lethargy characterized by  weak lingual manipulation, mild delay in oral transit, piecemeal  deglutition, delay in swallow initiation with nectars (fills  valleculae) and thins (spills to pyriforms) resulting in lingual  residue with semi-solids and flash penetration of nectars  (variable) and thins (consistent). No aspiration was observed. Pt  had loosed coughing episodes, but no barium noted in laryngeal  vestibule/airway. Recommend initiating D1/puree with nectar thick  liquids due to pt lethargy and lack of volitional cough.  Prognosis for upgraded textures is good with improved level of  alertness. Pt will need assist for all po intake.     Treatment Recommendation  Therapy as outlined in treatment plan below    Diet Recommendation Dysphagia 1 (Puree);Nectar-thick liquid   Liquid Administration via: Cup;Spoon Medication Administration: Crushed with puree Supervision: Full supervision/cueing for compensatory  strategies;Staff to assist with self feeding Compensations: Slow rate;Small sips/bites;Check for pocketing Postural Changes and/or Swallow Maneuvers: Out of bed for   meals;Seated upright 90 degrees;Upright 30-60 min after meal    Other  Recommendations Oral Care Recommendations: Oral care  BID;Staff/trained caregiver to provide oral care Other Recommendations: Order thickener from pharmacy;Clarify  dietary restrictions   Follow Up Recommendations  Skilled Nursing facility    Frequency and Duration min 2x/week  2 weeks       SLP Swallow Goals  Pt will demonstrate safe and efficient consumption of D1/puree  and nectar-thick liquids with mod assist.   General Date of Onset: 03/26/14 HPI: 80 year old  female with a history of diabetes mellitus,  hypertension, and irritable bowel syndrome presented with mental  status change and diarrhea.  The patient was in her usual state  of health up until 6 PM on 03/26/2014. The patient had an acute  onset of diarrhea without hematochezia or melena. The patient  became weak after her for loose bowel movement, and her family  later today around 6:30 PM. When the family came back  approximately 10-15 minutes later, the patient was minimally  responsive and not speaking. As a result, EMS was activated. In  the ED, the patient had an episode of emesis, but the patient's  son stated that she was able to communicate. The patient lives  with her granddaughter and everyone else in the household is  without any diarrheal illness. On the morning of 03/27/2014, the  patient became more encephalopathic. 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 Type of Study: Modified Barium Swallowing Study Reason for Referral: Objectively evaluate swallowing function Previous Swallow Assessment: BSE: NPO Diet Prior to this Study: NPO Temperature Spikes Noted: No Respiratory Status: Nasal cannula History of Recent Intubation: No Behavior/Cognition: Lethargic;Pleasant mood;Cooperative;Requires  cueing Oral Cavity - Dentition: Poor condition;Missing  dentition Oral Motor / Sensory Function: Impaired - see Bedside swallow  eval Self-Feeding Abilities: Needs assist Patient Positioning: Upright in chair Baseline Vocal Quality: Aphonic Volitional Cough: Cognitively unable to elicit Volitional Swallow: Unable to elicit Anatomy: Within functional limits Pharyngeal Secretions: Not observed secondary MBS    Reason for Referral Objectively evaluate swallowing function   Oral Phase Oral Preparation/Oral Phase Oral Phase: Impaired Oral - Solids Oral - Puree: Lingual/palatal residue Oral - Mechanical Soft: Weak lingual manipulation;Lingual/palatal  residue;Piecemeal swallowing;Delayed oral transit   Pharyngeal Phase Pharyngeal Phase Pharyngeal Phase: Impaired Pharyngeal - Nectar Pharyngeal - Nectar Teaspoon: Delayed swallow  initiation;Premature spillage to valleculae Pharyngeal - Nectar Cup: Delayed swallow initiation;Premature  spillage to valleculae;Penetration/Aspiration during swallow Penetration/Aspiration details (nectar cup): Material does not  enter airway;Material enters airway, remains ABOVE vocal cords  then ejected out Pharyngeal - Nectar Straw: Delayed swallow initiation;Premature  spillage to valleculae;Premature spillage to pyriform  sinuses;Penetration/Aspiration during swallow Penetration/Aspiration details (nectar straw): Material does not  enter airway;Material enters airway, remains ABOVE vocal cords  then ejected out Pharyngeal - Thin Pharyngeal - Thin Teaspoon: Delayed swallow initiation;Premature  spillage to pyriform sinuses;Premature spillage to  valleculae;Penetration/Aspiration during swallow Penetration/Aspiration details (thin teaspoon): Material does not  enter airway;Material enters airway, remains ABOVE vocal cords  then ejected out Pharyngeal - Thin Cup: Premature spillage to valleculae;Delayed  swallow initiation;Premature spillage to pyriform  sinuses;Penetration/Aspiration during swallow Penetration/Aspiration details (thin cup):  Material does not  enter airway;Material enters airway, remains ABOVE vocal cords  then ejected out Pharyngeal - Solids Pharyngeal - Puree: Within functional limits Pharyngeal - Mechanical Soft: Within functional limits Pharyngeal - Pill: Not tested  Cervical Esophageal Phase    GO    Cervical Esophageal Phase Cervical Esophageal Phase:  (unable to adequately visualize due  to increased body habitus)        Thank you,  Havery Moros, CCC-SLP [phone]  Dorene Ar 03/30/2014, 3:08 PM     EKG: Independently reviewed. sinus tachycardia.  Assessment/Plan Principal Problem:   Acute bronchiolitis Active Problems:   DIABETES MELLITUS, TYPE II, UNCONTROLLED   HYPERTENSION   GERD   Hypernatremia   H/O ischemic left ACA stroke  1. Acute bronchiolitis  the patient is presenting with sudden onset of shortness of breath after reaching the nursing home. As per the family she was also a little short of breath at the time of discharge. She was recently started on oral diet after speech evaluation. Her chest x-ray is clear for any acute pneumonia but patient is hypoxic and appearing in respiratory distress. With this probable etiology of her current presentation is acute bronchiolitis secondary to viral or bacterial etiology. I will check influenza and respiratory pathogen PCR, sputum cultures urine cultures. I would also start her on broad-spectrum antibiotic to cover her for aspiration as well as possible acute bronchitis. Oxygen as needed N.p.o. and speech evaluation again in morning.  2.Hypernatremia Patient has significant hypernatremia. Due to her last hospitalization she was negative for 5 L despite aggressive hydration. At present after half normal saline her sodium has increased therefore I will, treat her with IV dextrose 5%, monitor BMP every 2 hours, monitor urine output. At present with daily 1 L of urine output it does not appear that she has cerebral diabetes insipidus but may require  further workup if significant urine output is noted. Most likely her current etiology of hypernatremia is dehydration secondary to ongoing urine as well as bowel loss.Of fluid  3. Diabetes mellitus Unfortunately the patient will be on D5 drip continue sliding scale   4. history of recent stroke  Aspirin suppository Telemetry monitoring Speech therapy and PTOT consultation  DVT Prophylaxis: subcutaneous Heparin  n.p.o. Nutrition:  n.p.o.  Code Status:  DNR/DNI discussed with family  Family Communication:  family was present at bedside, opportunity was given to ask question and all questions were answered satisfactorily at the time of interview. Disposition: Admitted toinpatient in telemetry unit.  Author: Lynden Oxford, MD [facility] Pager: [phone] 03/31/2014, 10:42 PM    If 7PM-7AM, please contact night-coverage [email or web address] Password [email or web address]

## 2014-03-31 NOTE — Discharge Summary (Signed)
Physician Discharge Summary  Diane Rangel ZOX:[id number] DOB: Jun 28, 1934 DOA: 03/26/2014  PCP: Ernestine Conrad, MD  Admit date: 03/26/2014 Discharge date: 03/31/2014  Time spent: 45 minutes  Recommendations for Outpatient Follow-up:  -Will be discharged to SNF today. -Has 5 days of levaquin remaining for her UTI. -Please recheck sodium level in 3 days to ensure downward trend.   Discharge Diagnoses:  Principal Problem:   Sepsis Active Problems:   DIABETES MELLITUS, TYPE II, UNCONTROLLED   Gastroenteritis, acute   UTI (lower urinary tract infection)   Syncope, vasovagal   Gastroenteritis   Acute renal failure   Acute encephalopathy   Acute ischemic stroke   Hypernatremia   Discharge Condition: Stable and improved   Filed Weights   03/29/14 0425 03/30/14 0406 03/31/14 0425  Weight: 121.2 kg (267 lb 3.2 oz) 120.6 kg (265 lb 14 oz) 120.424 kg (265 lb 7.8 oz)    History of present illness:  80 yo female was about to wash some dishes tonight when she suddenly started having profuse diarrhea. Lives with granddtr. She has been fighting constipation for several days. Did not take anything for it today but did take some yesterday mirilax. [place] and kept having so much diarrhea, that she got very weak and her granddtr had to help. After about her fourth bout of diarrhea, she got very weak and passed out. Family called ems. She came too quickly. On arrival to ED she vomited, and since has had several more episodes of diarrhea. No fevers. Denies abd pain. No sick contacts. No bleeding. No focal neuro deficits. Has now a rectal tube with a lot of brown fecal material.  Hospitalist admission was requested.  Hospital Course:   Acute nonhemorhagic cerebral infarct  MRI/MRA confirmed left frontal infarct. Bilateral carotid bifurcation and proximal ICA plaque, resulting in less than 50% diameter stenosis per carotid doppler. 2D echo with very poor acoustic windows limit study.  Cannot fully evaluate RV systolic function. LV systolic function (based on parasternal views and apical 4 chamber view) is normal. lipids with HDL 33 otherwise WNL, HgA1c 6.8. evaluated by neurology who opine stroke workup unrevealing.  Continue asa and statin.   Dysphagia -2/2 acute CVA. -Seen by [facility] and on a dysphagia 1, nectar thick liquid diet. -Will need continued speech therapy follow up at [facility]  Sepsis  likely related to UTI In setting of likely dehydration from GI losses. Blood cultures negative to date. Urine culture klebsiella. Was initially on broad spectrum IV antibiotics, and this has been narrowed to levaquin for 5 more days given cx data and resumption of PO route.  Diarrhea  Slowing down significantly. Clostridium difficile PCR negative .GI pathogen panel negative  -Rectal tube has been discontinued.  Diabetes mellitus  -Hemoglobin A1c 6.8. CBG 129-155. Will decrease lantus to 10units and continue SSI.   Hypertension  Controlled. SBP range 101-129. Continue to hold home amlodipine, HCTZ, lisinopril.   ARF Resolved. Secondary to massive GI losses from severe diarrhea.  Hypernatremia  Likely related to no oral fluids. Down to 153 on DC. Will need to be rechecked in 3 days to ensure downward trend.    Procedures:  None   Consultations:  Neurology  Discharge Instructions  Discharge Orders   Future Orders Complete By Expires   Diet - low sodium heart healthy  As directed    Discontinue IV  As directed    Increase activity slowly  As directed  Medication List    STOP taking these medications       lisinopril-hydrochlorothiazide 20-12.5 MG per tablet  Commonly known as:  PRINZIDE,ZESTORETIC     naproxen 500 MG tablet  Commonly known as:  NAPROSYN     traMADol 50 MG tablet  Commonly known as:  ULTRAM      TAKE these medications       amLODipine 10 MG tablet  Commonly known as:  NORVASC  Take 10 mg by mouth daily.     aspirin EC 81 MG  tablet  Take 1 tablet (81 mg total) by mouth daily.     l-methylfolate-B6-B12 3-35-2 MG Tabs  Commonly known as:  METANX  Take 1 tablet by mouth daily.     levofloxacin 750 MG tablet  Commonly known as:  LEVAQUIN  Take 1 tablet (750 mg total) by mouth daily. For 5 days     NOVOLIN N RELION 100 UNIT/ML injection  Generic drug:  insulin NPH Human  Inject 30 Units into the skin 2 (two) times daily before a meal.     polyethylene glycol powder powder  Commonly known as:  GLYCOLAX/MIRALAX  Take 17 g by mouth daily.     ranitidine 150 MG tablet  Commonly known as:  ZANTAC  Take 150 mg by mouth 2 (two) times daily.       No Known Allergies    The results of significant diagnostics from this hospitalization (including imaging, microbiology, ancillary and laboratory) are listed below for reference.    Significant Diagnostic Studies: Ct Abdomen Pelvis Wo Contrast  03/27/2014   CLINICAL DATA:  Persistent diarrhea/abdominal pain  EXAM: CT ABDOMEN AND PELVIS WITHOUT CONTRAST  TECHNIQUE: Multidetector CT imaging of the abdomen and pelvis was performed following the standard protocol without intravenous contrast.  COMPARISON:  None.  FINDINGS: Increased density is appreciated within the lung bases right greater than left.  A trace amount of perihepatic ascites is appreciated. The liver is otherwise unremarkable. The gallbladder and gallbladder fossa are unremarkable.  Noncontrast evaluation of the spleen, adrenals, kidneys is unremarkable. There is near complete fatty replacement of the pancreas visualized portions unremarkable.  Within the limitations of a noncontrast CT there is no evidence of enteritis, diverticulitis nor appendicitis. There is no evidence bowel obstruction. No abdominal or pelvic free fluid, loculated fluid collections, masses nor adenopathy. There is no evidence of abdominal aortic aneurysm.  Within the rectosigmoid colon, there are areas of bowel wall prominence representing  either bowel wall edema versus partial decompression. Mild inflammatory change is also appreciated within the surrounding intraperitoneal fat. There is no evidence of free-fluid nor loculated fluid collections. There is no evidence of free air.  A fat containing anterior abdominal wall hernia is appreciated measuring 8 cm in diameter. There is no evidence of an inguinal hernia.  Severe osteoarthritic changes are appreciated within the left hip. Multilevel spondylosis is appreciated within the spine. There is no aggressive appearing osseous lesions.  IMPRESSION: 1. Findings which may reflect early or mild proctitis. No associated drainable loculated fluid collections are appreciated. 2. Atelectasis versus infiltrates within the lung bases right greater than left. 3. Very small amount of perihepatic ascites likely reactive 4. Fact containing anterior abdominal wall hernia 5. Degenerative changes within the spine, and left hip.   Electronically Signed   By: Salome Holmes M.D.   On: 03/27/2014 15:30   Ct Head Wo Contrast  03/26/2014   CLINICAL DATA:  Drug overdose.  Altered mental status.  EXAM: CT HEAD WITHOUT CONTRAST  TECHNIQUE: Contiguous axial images were obtained from the base of the skull through the vertex without intravenous contrast.  COMPARISON:  CT HEAD W/O CM dated 06/24/2004  FINDINGS: Chronic ischemic changes in the periventricular white matter and left caudate head. No mass effect, midline shift, or acute intracranial hemorrhage. Mastoid air cells are clear.  IMPRESSION: No acute intracranial pathology.   Electronically Signed   By: Maryclare BeanArt  Hoss M.D.   On: 03/26/2014 23:45   Mr Maxine GlennMra Head Wo Contrast  03/27/2014   CLINICAL DATA:  Diabetes and hypertension.  Altered mental status.  EXAM: MRI HEAD WITHOUT CONTRAST  MRA HEAD WITHOUT CONTRAST  TECHNIQUE: Multiplanar, multiecho pulse sequences of the brain and surrounding structures were obtained without intravenous contrast. Angiographic images of the head  were obtained using MRA technique without contrast.  COMPARISON:  03/26/2014 CT.  No comparison MR.  FINDINGS: Focal web-like stenosis right middle cerebral artery bifurcation.  Moderate to marked narrowing middle cerebral artery branches bilaterally.  Occluded right vertebral artery. Minimal retrograde flow distal right vertebral artery.  Non visualized posterior inferior cerebellar arteries.  Moderate narrowing anterior inferior cerebellar arteries.  Nonvisualized right superior cerebellar artery with moderate to marked narrowing left superior cerebral artery.  Moderate to marked web-like stenosis basilar tip.  Moderate to marked narrowing posterior cerebral artery mid to distal aspect bilaterally.  No aneurysm noted  IMPRESSION: MRI HEAD:  Acute moderate to large size medial left frontal lobe nonhemorrhagic infarct extending into the parietal lobe in a left anterior cerebral artery distribution.  Remote small basal ganglia and thalamic infarct some of which were partially hemorrhagic.  Small vessel disease type changes.  MRA HEAD:  Prominent intracranial atherosclerotic type changes as detailed above. This includes  abrupt cut off of flow proximal A2 segment left anterior cerebral artery consistent with patient's acute infarct.   Electronically Signed   By: Bridgett LarssonSteve  Olson M.D.   On: 03/27/2014 13:56   Mr Brain Wo Contrast  03/27/2014   CLINICAL DATA:  Diabetes and hypertension.  Altered mental status.  EXAM: MRI HEAD WITHOUT CONTRAST  MRA HEAD WITHOUT CONTRAST  TECHNIQUE: Multiplanar, multiecho pulse sequences of the brain and surrounding structures were obtained without intravenous contrast. Angiographic images of the head were obtained using MRA technique without contrast.  COMPARISON:  03/26/2014 CT.  No comparison MR.  FINDINGS: Focal web-like stenosis right middle cerebral artery bifurcation.  Moderate to marked narrowing middle cerebral artery branches bilaterally.  Occluded right vertebral artery. Minimal  retrograde flow distal right vertebral artery.  Non visualized posterior inferior cerebellar arteries.  Moderate narrowing anterior inferior cerebellar arteries.  Nonvisualized right superior cerebellar artery with moderate to marked narrowing left superior cerebral artery.  Moderate to marked web-like stenosis basilar tip.  Moderate to marked narrowing posterior cerebral artery mid to distal aspect bilaterally.  No aneurysm noted  IMPRESSION: MRI HEAD:  Acute moderate to large size medial left frontal lobe nonhemorrhagic infarct extending into the parietal lobe in a left anterior cerebral artery distribution.  Remote small basal ganglia and thalamic infarct some of which were partially hemorrhagic.  Small vessel disease type changes.  MRA HEAD:  Prominent intracranial atherosclerotic type changes as detailed above. This includes  abrupt cut off of flow proximal A2 segment left anterior cerebral artery consistent with patient's acute infarct.   Electronically Signed   By: Bridgett LarssonSteve  Olson M.D.   On: 03/27/2014 13:56   [place] Renal  03/28/2014   CLINICAL DATA:  Worsening  renal function, hypertension, diabetes  EXAM: RENAL/URINARY TRACT ULTRASOUND COMPLETE  COMPARISON:  CT abdomen and pelvis 03/27/2014  TECHNIQUE: Sonography of the kidneys and urinary bladder performed. Exam quality degraded secondary to body habitus.  FINDINGS: Right Kidney:  Length: 8.9 cm. Limited visualization. Lower pole partially obscured. Grossly normal cortical echogenicity. Mild cortical thinning. No gross mass or hydronephrosis.  Left Kidney:  Length: 9.1 cm. Limited visualization unable to adequately visualized to exclude mass or obstruction.  Bladder:  Decompressed by Foley catheter, unable to evaluate  IMPRESSION: Extremely limited exam secondary to body habitus.  Grossly unremarkable right kidney.  Inadequate assessment of left kidney an urinary bladder.   Electronically Signed   By: Ulyses Southward M.D.   On: 03/28/2014 14:38   US Carotid  Bilateral  03/27/2014   CLINICAL DATA:  Stroke, hypertension, altered mental status  EXAM: BILATERAL CAROTID DUPLEX ULTRASOUND  TECHNIQUE: Wallace Cullens scale imaging, color Doppler and duplex ultrasound was performed of bilateral carotid and vertebral arteries in the neck.  COMPARISON:  None.  REVIEW OF SYSTEMS: Quantification of carotid stenosis is based on velocity parameters that correlate the residual internal carotid diameter with NASCET-based stenosis levels, using the diameter of the distal internal carotid lumen as the denominator for stenosis measurement.  The following velocity measurements were obtained:  PEAK SYSTOLIC/END DIASTOLIC  RIGHT  ICA:                     85/16cm/sec  CCA:                     75/7cm/sec  SYSTOLIC ICA/CCA RATIO:  1.13  DIASTOLIC ICA/CCA RATIO: 2.26  ECA:                     110cm/sec  LEFT  ICA:                     83/10cm/sec  CCA:                     77/10cm/sec  SYSTOLIC ICA/CCA RATIO:  1.08  DIASTOLIC ICA/CCA RATIO: 1.0  ECA:                     86cm/sec  FINDINGS: RIGHT CAROTID ARTERY: Intimal thickening through the common carotid artery. There circumferential plaque in the carotid bulb. There is eccentric moderate partially calcified plaque in the proximal ICA resulting in at least mild stenosis. Normal waveforms and peak systolic velocities however. Distal ICA mildly tortuous.  RIGHT VERTEBRAL ARTERY:  Normal flow direction and waveform.  LEFT CAROTID ARTERY: Intimal thickening through the mildly tortuous common carotid artery. There is noncalcified plaque in the carotid bulb extending into the proximal ICA without high-grade stenosis. Normal waveforms and color Doppler signal. Distal ICA is tortuous.  LEFT VERTEBRAL ARTERY: Normal flow direction and waveform.  IMPRESSION: 1. Bilateral carotid bifurcation and proximal ICA plaque, resulting in less than 50% diameter stenosis. The exam does not exclude plaque ulceration or embolization. Continued surveillance recommended.    Electronically Signed   By: Oley Balm M.D.   On: 03/27/2014 16:53   Dg Chest Port 1 View  03/29/2014   CLINICAL DATA:  Question aspiration pneumonia.  EXAM: PORTABLE CHEST - 1 VIEW  COMPARISON:  03/26/2014 and 12/19/2012.  FINDINGS: 0943 hr. There are persistent low lung volumes with slightly increased dependent opacities at both lung bases, likely atelectasis. Early aspiration cannot be excluded. Vascular congestion  and cardiomegaly are stable. There is no significant pleural effusion. Telemetry leads overlie the chest.  IMPRESSION: Slightly increased bibasilar opacities likely represent atelectasis given the low lung volumes. However, early aspiration cannot be excluded.   Electronically Signed   By: Roxy Horseman M.D.   On: 03/29/2014 11:14   Dg Chest Port 1 View  03/26/2014   CLINICAL DATA:  Shortness of Breath  EXAM: PORTABLE CHEST - 1 VIEW  COMPARISON:  December 19, 2012  FINDINGS: There is no edema or consolidation. Heart is mildly enlarged with normal pulmonary vascularity. No adenopathy. There is degenerative change in the thoracic spine.  IMPRESSION: Mild cardiac enlargement.  No edema or consolidation.   Electronically Signed   By: Bretta Bang M.D.   On: 03/26/2014 21:18   Dg Abd 2 Views  03/26/2014   CLINICAL DATA:  Diarrhea  EXAM: ABDOMEN - 2 VIEW  COMPARISON:  None.  FINDINGS: Study is limited due to body habitus. Gaseous distention of colon and possibly small bowel is present. Left pelvic phlebolith. No obvious free intraperitoneal gas.  IMPRESSION: Limited exam.  Distended loops of colon are nonspecific.   Electronically Signed   By: Maryclare Bean M.D.   On: 03/26/2014 23:29   Dg Swallowing Func-speech Pathology  03/30/2014   Dorene Ar, CCC-SLP     03/30/2014  3:09 PM Objective Swallowing Evaluation: Modified Barium Swallowing Study    Patient Details  Name: BEULAH CAPOBIANCO MRN: [id number] Date of Birth: 11-04-1934  Today's Date: 03/30/2014 Time: 1120-1151 SLP Time Calculation  (min): 31 min  Past Medical History:  Past Medical History  Diagnosis Date  . Hypertension   . Diabetes mellitus without complication   . CVA (cerebral infarction)   . UTI (lower urinary tract infection)     klebsiella   Past Surgical History:  Past Surgical History  Procedure Laterality Date  . Ankle surgery     HPI:  80 year old female with a history of diabetes mellitus,  hypertension, and irritable bowel syndrome presented with mental  status change and diarrhea.  The patient was in her usual state  of health up until 6 PM on 03/26/2014. The patient had an acute  onset of diarrhea without hematochezia or melena. The patient  became weak after her for loose bowel movement, and her family  later today around 6:30 PM. When the family came back  approximately 10-15 minutes later, the patient was minimally  responsive and not speaking. As a result, EMS was activated. In  the ED, the patient had an episode of emesis, but the patient's  son stated that she was able to communicate. The patient lives  with her granddaughter and everyone else in the household is  without any diarrheal illness. On the morning of 03/27/2014, the  patient became more encephalopathic. 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     Assessment / Plan / Recommendation Clinical Impression  Dysphagia Diagnosis: Mild oral phase dysphagia;Mild pharyngeal  phase dysphagia Clinical impression: Pt presents with mild oropharyngeal  dysphagia in setting of acute CVA and lethargy characterized by  weak lingual manipulation, mild delay in oral transit, piecemeal  deglutition, delay in swallow initiation with nectars (fills  valleculae) and thins (spills to pyriforms) resulting in lingual  residue with semi-solids and flash penetration of nectars  (variable) and thins (consistent). No aspiration was observed. Pt  had loosed  coughing  episodes, but no barium noted in laryngeal  vestibule/airway. Recommend initiating D1/puree with nectar thick  liquids due to pt lethargy and lack of volitional cough.  Prognosis for upgraded textures is good with improved level of  alertness. Pt will need assist for all po intake.     Treatment Recommendation  Therapy as outlined in treatment plan below    Diet Recommendation Dysphagia 1 (Puree);Nectar-thick liquid   Liquid Administration via: Cup;Spoon Medication Administration: Crushed with puree Supervision: Full supervision/cueing for compensatory  strategies;Staff to assist with self feeding Compensations: Slow rate;Small sips/bites;Check for pocketing Postural Changes and/or Swallow Maneuvers: Out of bed for  meals;Seated upright 90 degrees;Upright 30-60 min after meal    Other  Recommendations Oral Care Recommendations: Oral care  BID;Staff/trained caregiver to provide oral care Other Recommendations: Order thickener from pharmacy;Clarify  dietary restrictions   Follow Up Recommendations  Skilled Nursing facility    Frequency and Duration min 2x/week  2 weeks       SLP Swallow Goals  Pt will demonstrate safe and efficient consumption of D1/puree  and nectar-thick liquids with mod assist.   General Date of Onset: 03/26/14 HPI: 80 year old female with a history of diabetes mellitus,  hypertension, and irritable bowel syndrome presented with mental  status change and diarrhea.  The patient was in her usual state  of health up until 6 PM on 03/26/2014. The patient had an acute  onset of diarrhea without hematochezia or melena. The patient  became weak after her for loose bowel movement, and her family  later today around 6:30 PM. When the family came back  approximately 10-15 minutes later, the patient was minimally  responsive and not speaking. As a result, EMS was activated. In  the ED, the patient had an episode of emesis, but the patient's  son stated that she was able to communicate. The patient lives  with  her granddaughter and everyone else in the household is  without any diarrheal illness. On the morning of 03/27/2014, the  patient became more encephalopathic. 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 Type of Study: Modified Barium Swallowing Study Reason for Referral: Objectively evaluate swallowing function Previous Swallow Assessment: BSE: NPO Diet Prior to this Study: NPO Temperature Spikes Noted: No Respiratory Status: Nasal cannula History of Recent Intubation: No Behavior/Cognition: Lethargic;Pleasant mood;Cooperative;Requires  cueing Oral Cavity - Dentition: Poor condition;Missing dentition Oral Motor / Sensory Function: Impaired - see Bedside swallow  eval Self-Feeding Abilities: Needs assist Patient Positioning: Upright in chair Baseline Vocal Quality: Aphonic Volitional Cough: Cognitively unable to elicit Volitional Swallow: Unable to elicit Anatomy: Within functional limits Pharyngeal Secretions: Not observed secondary MBS    Reason for Referral Objectively evaluate swallowing function   Oral Phase Oral Preparation/Oral Phase Oral Phase: Impaired Oral - Solids Oral - Puree: Lingual/palatal residue Oral - Mechanical Soft: Weak lingual manipulation;Lingual/palatal  residue;Piecemeal swallowing;Delayed oral transit   Pharyngeal Phase Pharyngeal Phase Pharyngeal Phase: Impaired Pharyngeal - Nectar Pharyngeal - Nectar Teaspoon: Delayed swallow  initiation;Premature spillage to valleculae Pharyngeal - Nectar Cup: Delayed swallow initiation;Premature  spillage to valleculae;Penetration/Aspiration during swallow Penetration/Aspiration details (nectar cup): Material does not  enter airway;Material enters airway, remains ABOVE vocal cords  then ejected out Pharyngeal - Nectar Straw: Delayed swallow initiation;Premature  spillage to valleculae;Premature spillage to pyriform   sinuses;Penetration/Aspiration during swallow Penetration/Aspiration details (nectar straw): Material does not  enter airway;Material enters  airway, remains ABOVE vocal cords  then ejected out Pharyngeal - Thin Pharyngeal - Thin Teaspoon: Delayed swallow initiation;Premature  spillage to pyriform sinuses;Premature spillage to  valleculae;Penetration/Aspiration during swallow Penetration/Aspiration details (thin teaspoon): Material does not  enter airway;Material enters airway, remains ABOVE vocal cords  then ejected out Pharyngeal - Thin Cup: Premature spillage to valleculae;Delayed  swallow initiation;Premature spillage to pyriform  sinuses;Penetration/Aspiration during swallow Penetration/Aspiration details (thin cup): Material does not  enter airway;Material enters airway, remains ABOVE vocal cords  then ejected out Pharyngeal - Solids Pharyngeal - Puree: Within functional limits Pharyngeal - Mechanical Soft: Within functional limits Pharyngeal - Pill: Not tested  Cervical Esophageal Phase    GO    Cervical Esophageal Phase Cervical Esophageal Phase:  (unable to adequately visualize due  to increased body habitus)        Thank you,  Havery Moros, CCC-SLP [phone]  Dorene Ar 03/30/2014, 3:08 PM     Microbiology: Recent Results (from the past 240 hour(s))  URINE CULTURE     Status: None   Collection Time    03/26/14  9:57 PM      Result Value Ref Range Status   Specimen Description URINE, CATHETERIZED   Final   Special Requests NONE   Final   Culture  Setup Time     Final   Value: 03/27/2014 02:30     Performed at [facility] Count     Final   Value: >=100,000 COLONIES/ML     Performed at [facility]   Culture     Final   Value: KLEBSIELLA PNEUMONIAE     Performed at [facility]   Report Status 03/28/2014 FINAL   Final   Organism ID, Bacteria KLEBSIELLA PNEUMONIAE   Final  CLOSTRIDIUM DIFFICILE BY PCR     Status: None   Collection Time     03/26/14 10:10 PM      Result Value Ref Range Status   C difficile by pcr NEGATIVE  NEGATIVE Final  CULTURE, BLOOD (ROUTINE X 2)     Status: None   Collection Time    03/27/14  9:57 AM      Result Value Ref Range Status   Specimen Description BLOOD RIGHT ANTECUBITAL   Final   Special Requests     Final   Value: BOTTLES DRAWN AEROBIC AND ANAEROBIC AEB=9CC ANA=11CC   Culture NO GROWTH 3 DAYS   Final   Report Status PENDING   Incomplete  CULTURE, BLOOD (ROUTINE X 2)     Status: None   Collection Time    03/27/14 10:03 AM      Result Value Ref Range Status   Specimen Description BLOOD LEFT HAND   Final   Special Requests BOTTLES DRAWN AEROBIC AND ANAEROBIC 10CC   Final   Culture NO GROWTH 3 DAYS   Final   Report Status PENDING   Incomplete     Labs: Basic Metabolic Panel:  Recent Labs Lab 03/27/14 0631 03/28/14 0547 03/29/14 0542 03/30/14 0618 03/31/14 0556  NA 139 143 148* 153* 153*  K 4.7 4.7 3.7 3.6* 3.7  CL 98 104 112 117* 117*  CO2 22 22 22 24 26   GLUCOSE 304* 230* 115* 145* 224*  BUN 32* 58* 58* 48* 44*  CREATININE 1.30* 2.55* 1.74* 1.16* 1.02  CALCIUM 8.7 8.5 8.2* 8.4 8.5   Liver Function Tests:  Recent Labs Lab 03/26/14 2049  AST 34  ALT 19  ALKPHOS 83  BILITOT 0.7  PROT 7.5  ALBUMIN 3.5    Recent Labs Lab 03/26/14 2049  LIPASE 45   No results found for this basename: AMMONIA,  in the last 168 hours CBC:  Recent Labs Lab 03/26/14 2049 03/27/14 0631 03/28/14 0547 03/29/14 0542 03/30/14 0618 03/31/14 0556  WBC 20.5* 24.9* 21.1* 14.4* 16.0* 11.0*  NEUTROABS 17.3*  --   --   --   --   --   HGB 17.0* 16.0* 14.0 12.5 12.2 12.7  HCT 49.5* 47.1* 41.4 37.9 36.7 39.5  MCV 88.1 88.9 88.1 89.2 89.1 90.0  PLT 279 244 216 207 213 216   Cardiac Enzymes:  Recent Labs Lab 03/26/14 2049  TROPONINI <0.30   BNP: BNP (last 3 results)  Recent Labs  03/26/14 2049  PROBNP 231.2   CBG:  Recent Labs Lab 03/30/14 0401 03/30/14 1157  03/30/14 1841 03/31/14 0729 03/31/14 1133  GLUCAP 129* 155* 318* 188* 242*       Signed:  Henderson CloudEstela Y Hernandez Acosta  [facility] Pager: [phone] 03/31/2014, 12:35 PM

## 2014-03-31 NOTE — Progress Notes (Addendum)
ANTIBIOTIC CONSULT NOTE - INITIAL  Pharmacy Consult for cefepime + vancomycin  Indication: pneumonia  No Known Allergies  Patient Measurements:   Adjusted Body Weight:   Vital Signs: Temp: 99 F (37.2 C) (04/24 1345) Temp src: Oral (04/24 1345) BP: 178/83 mmHg (04/24 1900) Pulse Rate: 103 (04/24 1900) Intake/Output from previous day:   Intake/Output from this shift:    Labs:  Recent Labs  03/30/14 0618 03/31/14 0556 03/31/14 1809  WBC 16.0* 11.0* 11.4*  HGB 12.2 12.7 13.0  PLT 213 216 211  CREATININE 1.16* 1.02 0.89   The CrCl is unknown because both a height and weight (above a minimum accepted value) are required for this calculation. No results found for this basename: VANCOTROUGH, Leodis BinetVANCOPEAK, VANCORANDOM, GENTTROUGH, GENTPEAK, GENTRANDOM, TOBRATROUGH, TOBRAPEAK, TOBRARND, AMIKACINPEAK, AMIKACINTROU, AMIKACIN,  in the last 72 hours   Microbiology: Recent Results (from the past 720 hour(s))  URINE CULTURE     Status: None   Collection Time    03/26/14  9:57 PM      Result Value Ref Range Status   Specimen Description URINE, CATHETERIZED   Final   Special Requests NONE   Final   Culture  Setup Time     Final   Value: 03/27/2014 02:30     [facility]formed at [facility]   Colony Count     Final   Value: >=100,000 COLONIES/ML     [facility]formed at [facility]   Culture     Final   Value: KLEBSIELLA PNEUMONIAE     Performed at [facility]   Report Status 03/28/2014 FINAL   Final   Organism ID, Bacteria KLEBSIELLA PNEUMONIAE   Final  CLOSTRIDIUM DIFFICILE BY PCR     Status: None   Collection Time    03/26/14 10:10 PM      Result Value Ref Range Status   C difficile by pcr NEGATIVE  NEGATIVE Final  CULTURE, BLOOD (ROUTINE X 2)     Status: None   Collection Time    03/27/14  9:57 AM      Result Value Ref Range Status   Specimen Description BLOOD RIGHT ANTECUBITAL   Final   Special Requests     Final   Value: BOTTLES DRAWN AEROBIC AND  ANAEROBIC AEB=9CC ANA=11CC   Culture NO GROWTH 3 DAYS   Final   Report Status PENDING   Incomplete  CULTURE, BLOOD (ROUTINE X 2)     Status: None   Collection Time    03/27/14 10:03 AM      Result Value Ref Range Status   Specimen Description BLOOD LEFT HAND   Final   Special Requests BOTTLES DRAWN AEROBIC AND ANAEROBIC 10CC   Final   Culture NO GROWTH 3 DAYS   Final   Report Status PENDING   Incomplete    Medical History: Past Medical History  Diagnosis Date  . Hypertension   . Diabetes mellitus without complication   . CVA (cerebral infarction)   . UTI (lower urinary tract infection)     klebsiella  . Stroke     Medications:   (Not in a hospital admission) Scheduled:  . albuterol       Infusions:  . sodium chloride    . vancomycin     Assessment: 80 yo who [facility] but now back with [facility]. Cefepime has been ordered for PNA. She has been on zosyn>>levaquin. Vancomycin now added for MRSA coverage. Est crcl~4550ml/min wt 120kg  Plan:   Cefepime 2g IV q12 F/u with cultures Vancomycin 2.5gmx1 then 1250mg  q12h [facility]

## 2014-04-01 ENCOUNTER — Inpatient Hospital Stay (HOSPITAL_COMMUNITY): Payer: Medicare Other

## 2014-04-01 DIAGNOSIS — I639 Cerebral infarction, unspecified: Secondary | ICD-10-CM | POA: Diagnosis present

## 2014-04-01 DIAGNOSIS — Z8673 Personal history of transient ischemic attack (TIA), and cerebral infarction without residual deficits: Secondary | ICD-10-CM

## 2014-04-01 DIAGNOSIS — I6340 Cerebral infarction due to embolism of unspecified cerebral artery: Secondary | ICD-10-CM

## 2014-04-01 LAB — CULTURE, BLOOD (ROUTINE X 2)
CULTURE: NO GROWTH
Culture: NO GROWTH

## 2014-04-01 LAB — BASIC METABOLIC PANEL
BUN: 24 mg/dL — ABNORMAL HIGH (ref 6–23)
BUN: 24 mg/dL — ABNORMAL HIGH (ref 6–23)
BUN: 24 mg/dL — ABNORMAL HIGH (ref 6–23)
BUN: 25 mg/dL — AB (ref 6–23)
BUN: 25 mg/dL — ABNORMAL HIGH (ref 6–23)
BUN: 26 mg/dL — AB (ref 6–23)
BUN: 27 mg/dL — ABNORMAL HIGH (ref 6–23)
BUN: 28 mg/dL — ABNORMAL HIGH (ref 6–23)
BUN: 29 mg/dL — ABNORMAL HIGH (ref 6–23)
BUN: 32 mg/dL — ABNORMAL HIGH (ref 6–23)
BUN: 33 mg/dL — ABNORMAL HIGH (ref 6–23)
CALCIUM: 7.7 mg/dL — AB (ref 8.4–10.5)
CALCIUM: 8.1 mg/dL — AB (ref 8.4–10.5)
CALCIUM: 8.3 mg/dL — AB (ref 8.4–10.5)
CALCIUM: 8.4 mg/dL (ref 8.4–10.5)
CALCIUM: 8.4 mg/dL (ref 8.4–10.5)
CALCIUM: 8.5 mg/dL (ref 8.4–10.5)
CALCIUM: 8.5 mg/dL (ref 8.4–10.5)
CHLORIDE: 122 meq/L — AB (ref 96–112)
CHLORIDE: 120 meq/L — AB (ref 96–112)
CO2: 22 mEq/L (ref 19–32)
CO2: 22 meq/L (ref 19–32)
CO2: 23 mEq/L (ref 19–32)
CO2: 23 mEq/L (ref 19–32)
CO2: 23 mEq/L (ref 19–32)
CO2: 23 meq/L (ref 19–32)
CO2: 24 mEq/L (ref 19–32)
CO2: 24 mEq/L (ref 19–32)
CO2: 24 meq/L (ref 19–32)
CO2: 25 mEq/L (ref 19–32)
CO2: 25 meq/L (ref 19–32)
CREATININE: 0.69 mg/dL (ref 0.50–1.10)
CREATININE: 0.69 mg/dL (ref 0.50–1.10)
Calcium: 8.5 mg/dL (ref 8.4–10.5)
Calcium: 8.3 mg/dL — ABNORMAL LOW (ref 8.4–10.5)
Calcium: 8.2 mg/dL — ABNORMAL LOW (ref 8.4–10.5)
Calcium: 8.4 mg/dL (ref 8.4–10.5)
Chloride: 121 mEq/L — ABNORMAL HIGH (ref 96–112)
Chloride: 122 mEq/L — ABNORMAL HIGH (ref 96–112)
Chloride: 120 mEq/L — ABNORMAL HIGH (ref 96–112)
Chloride: 119 mEq/L — ABNORMAL HIGH (ref 96–112)
Chloride: 122 mEq/L — ABNORMAL HIGH (ref 96–112)
Chloride: 117 mEq/L — ABNORMAL HIGH (ref 96–112)
Chloride: 117 mEq/L — ABNORMAL HIGH (ref 96–112)
Chloride: 118 mEq/L — ABNORMAL HIGH (ref 96–112)
Chloride: 118 mEq/L — ABNORMAL HIGH (ref 96–112)
Creatinine, Ser: 0.74 mg/dL (ref 0.50–1.10)
Creatinine, Ser: 0.75 mg/dL (ref 0.50–1.10)
Creatinine, Ser: 0.67 mg/dL (ref 0.50–1.10)
Creatinine, Ser: 0.68 mg/dL (ref 0.50–1.10)
Creatinine, Ser: 0.69 mg/dL (ref 0.50–1.10)
Creatinine, Ser: 0.71 mg/dL (ref 0.50–1.10)
Creatinine, Ser: 0.61 mg/dL (ref 0.50–1.10)
Creatinine, Ser: 0.63 mg/dL (ref 0.50–1.10)
Creatinine, Ser: 0.63 mg/dL (ref 0.50–1.10)
GFR calc Af Amer: >90 mL/min (ref >90)
GFR calc Af Amer: >90 mL/min (ref >90)
GFR calc Af Amer: >90 mL/min (ref >90)
GFR calc Af Amer: >90 mL/min (ref >90)
GFR calc Af Amer: >90 mL/min (ref >90)
GFR calc Af Amer: >90 mL/min (ref >90)
GFR calc non Af Amer: 79 mL/min — ABNORMAL LOW (ref >90)
GFR calc non Af Amer: 82 mL/min — ABNORMAL LOW (ref >90)
GFR calc non Af Amer: 82 mL/min — ABNORMAL LOW (ref >90)
GFR calc non Af Amer: 80 mL/min — ABNORMAL LOW (ref >90)
GFR calc non Af Amer: 85 mL/min — ABNORMAL LOW (ref >90)
GFR calc non Af Amer: 81 mL/min — ABNORMAL LOW (ref >90)
GFR, EST NON AFRICAN AMERICAN: 79 mL/min — AB (ref >90)
GFR, EST NON AFRICAN AMERICAN: 81 mL/min — AB (ref >90)
GFR, EST NON AFRICAN AMERICAN: 81 mL/min — AB (ref >90)
GFR, EST NON AFRICAN AMERICAN: 84 mL/min — AB (ref >90)
GFR, EST NON AFRICAN AMERICAN: 84 mL/min — AB (ref >90)
GLUCOSE: 248 mg/dL — AB (ref 70–99)
GLUCOSE: 230 mg/dL — AB (ref 70–99)
GLUCOSE: 249 mg/dL — AB (ref 70–99)
Glucose, Bld: 245 mg/dL — ABNORMAL HIGH (ref 70–99)
Glucose, Bld: 230 mg/dL — ABNORMAL HIGH (ref 70–99)
Glucose, Bld: 233 mg/dL — ABNORMAL HIGH (ref 70–99)
Glucose, Bld: 245 mg/dL — ABNORMAL HIGH (ref 70–99)
Glucose, Bld: 254 mg/dL — ABNORMAL HIGH (ref 70–99)
Glucose, Bld: 238 mg/dL — ABNORMAL HIGH (ref 70–99)
Glucose, Bld: 259 mg/dL — ABNORMAL HIGH (ref 70–99)
Glucose, Bld: 292 mg/dL — ABNORMAL HIGH (ref 70–99)
POTASSIUM: 3.8 meq/L (ref 3.7–5.3)
POTASSIUM: 3.7 meq/L (ref 3.7–5.3)
POTASSIUM: 3.8 meq/L (ref 3.7–5.3)
POTASSIUM: 4 meq/L (ref 3.7–5.3)
POTASSIUM: 3.8 meq/L (ref 3.7–5.3)
Potassium: 3.8 mEq/L (ref 3.7–5.3)
Potassium: 4 mEq/L (ref 3.7–5.3)
Potassium: 4.3 mEq/L (ref 3.7–5.3)
Potassium: 3.9 mEq/L (ref 3.7–5.3)
Potassium: 3.7 mEq/L (ref 3.7–5.3)
Potassium: 4 mEq/L (ref 3.7–5.3)
SODIUM: 153 meq/L — AB (ref 137–147)
SODIUM: 154 meq/L — AB (ref 137–147)
SODIUM: 154 meq/L — AB (ref 137–147)
SODIUM: 154 meq/L — AB (ref 137–147)
SODIUM: 156 meq/L — AB (ref 137–147)
SODIUM: 156 meq/L — AB (ref 137–147)
SODIUM: 156 meq/L — AB (ref 137–147)
SODIUM: 157 meq/L — AB (ref 137–147)
Sodium: 154 mEq/L — ABNORMAL HIGH (ref 137–147)
Sodium: 155 mEq/L — ABNORMAL HIGH (ref 137–147)
Sodium: 154 mEq/L — ABNORMAL HIGH (ref 137–147)

## 2014-04-01 LAB — GLUCOSE, CAPILLARY
GLUCOSE-CAPILLARY: 222 mg/dL — AB (ref 70–99)
GLUCOSE-CAPILLARY: 230 mg/dL — AB (ref 70–99)
Glucose-Capillary: 229 mg/dL — ABNORMAL HIGH (ref 70–99)
Glucose-Capillary: 216 mg/dL — ABNORMAL HIGH (ref 70–99)

## 2014-04-01 LAB — STREP PNEUMONIAE URINARY ANTIGEN: Strep Pneumo Urinary Antigen: NEGATIVE

## 2014-04-01 LAB — INFLUENZA PANEL BY PCR (TYPE A & B)
H1N1FLUPCR: NOT DETECTED
INFLAPCR: NEGATIVE
Influenza B By PCR: NEGATIVE

## 2014-04-01 LAB — OSMOLALITY: Osmolality: 328 mOsm/kg — ABNORMAL HIGH (ref 275–300)

## 2014-04-01 LAB — SODIUM, URINE, RANDOM: SODIUM UR: 45 meq/L

## 2014-04-01 LAB — OSMOLALITY, URINE: OSMOLALITY UR: 624 mosm/kg (ref 390–1090)

## 2014-04-01 LAB — CLOSTRIDIUM DIFFICILE BY PCR: CDIFFPCR: NEGATIVE

## 2014-04-01 MED ORDER — IPRATROPIUM-ALBUTEROL 0.5-2.5 (3) MG/3ML IN SOLN
3.0000 mL | Freq: Three times a day (TID) | RESPIRATORY_TRACT | Status: DC
  Administered 2014-04-01 – 2014-04-06 (×14): 3 mL via RESPIRATORY_TRACT
  Filled 2014-04-01 (×14): qty 3

## 2014-04-01 MED ORDER — STARCH (THICKENING) PO POWD
ORAL | Status: DC | PRN
  Filled 2014-04-01: qty 227

## 2014-04-01 MED ORDER — INSULIN ASPART 100 UNIT/ML ~~LOC~~ SOLN: 0.0000 [IU] | Freq: Every day | SUBCUTANEOUS | Status: DC

## 2014-04-01 MED ORDER — DEXAMETHASONE 4 MG PO TABS
4.0000 mg | ORAL_TABLET | Freq: Three times a day (TID) | ORAL | Status: DC
  Administered 2014-04-01 – 2014-04-03 (×5): 4 mg via ORAL
  Filled 2014-04-01 (×8): qty 1

## 2014-04-01 MED ORDER — INSULIN ASPART 100 UNIT/ML ~~LOC~~ SOLN
0.0000 [IU] | Freq: Four times a day (QID) | SUBCUTANEOUS | Status: DC
  Administered 2014-04-01: 5 [IU] via SUBCUTANEOUS
  Administered 2014-04-01: 3 [IU] via SUBCUTANEOUS
  Administered 2014-04-02: 5 [IU] via SUBCUTANEOUS
  Administered 2014-04-02: 8 [IU] via SUBCUTANEOUS

## 2014-04-01 MED ORDER — DEXTROSE 5 % IV SOLN
INTRAVENOUS | Status: DC
  Administered 2014-04-01: 04:00:00 via INTRAVENOUS

## 2014-04-01 NOTE — Progress Notes (Addendum)
[facility] PROGRESS NOTE  Diane Rangel [id number] DOB: 1934-05-11 DOA: 03/31/2014 PCP: Ernestine ConradBLUTH, KIRK, MD   Brief narrative 80 y/o female with recent left ACA stroke with residual right hemiplegia and mental status changes. She was discharged from [facility] to [facility] only yesterday. Prior to discharge she had diarrhea and hypernatremia and required rectal tube placement. Barely after arriving to SNF she developed fever and increasing lethargy [facility] CT showed evolution of left ACA stoke. Her hypernatremia worsened to 157. MRI brain repeated this am showed evolution of left ACA infarct with 2mm left to right midline  shift and 2 new punctate infarcts over bilateral temporal lobes.   Assessment/Plan: Acute recurrent CVA Evolution of recent left ACA infarct and 2 new infarct on MRI brain ( left temporal and rt parietal ) suggestive of embolic stroke.. Has 2 mm midline shift as well and  Encephalopathy worsened. Seen by swallow eval and recommended dys level 1 diet. Continue ASA suppository. Continue with neuro checks. Recently had carotids and 2D echo so need not repeat. Will defer to  neurology on getting TEE given b/l infarct. Allow permissive HTN. PT/OT eval Monitor on tele Will place on oral decadron given midline shift.  Hypernatremia  Patient has significant hypernatremia. Possibly from dehydration.  Was admitted to [facility] with normal sodium levels. possobly from dehydration. Elevated serum osm. Urine Na normal. Follow urine osm. Placed on D5W and slowly improving. Monitor closely.  Fever and shortness of breath Patient afebrile here. resp status stable. Reportedly c/o abdomen pain in ED . abd x ray was unremarkable. Possibly has some bronchitis or early pneumonia. Placed on empiric vanco and cefepime. Follow cx.   Diabetes mellitus  Patient on D5 drip.  continue sliding scale   UTI  cx growing klebsiella. On levaquin recently . Now empirically covered with broad  abx   DVT Prophylaxis: SCD given small hemorrhages on head CT        Code Status: DNR Family Communication: son at bedside Disposition Plan: SNF   Consultants:  neurology  Procedures:  MRI brian  Antibiotics:  none  HPI/Subjective: Admission H&P reviewed. patient has right sided hemiplegia and left hemipareis . Poorly communicative.  Objective: Filed Vitals:   04/01/14 1439  BP: 160/74  Pulse: 104  Temp: 97.8 F (36.6 C)  Resp: 22    Intake/Output Summary (Last 24 hours) at 04/01/14 1601 Last data filed at 04/01/14 0700  Gross per 24 hour  Intake      0 ml  Output     50 ml  Net    -50 ml   Filed Weights   03/31/14 2300 04/01/14 0000  Weight: 120 kg (264 lb 8.8 oz) 118.7 kg (261 lb 11 oz)    Exam:   General:  Elderly female sitting up in bed , has flattened affect  HEENT: no pallor, moist oral mucosa, right  Facial droop  Chest: clear b/l, no added sounds  CVS: NS1&S2, no MRG  Abd: soft, NT, ND, BS+, rectal tube and foley in place Ext: warm, no edema CNS: AAOX0, right facial droop, 3/5 power over LUE,    Data Reviewed: Basic Metabolic Panel:  Recent Labs Lab 04/01/14 0515 04/01/14 0725 04/01/14 0830 04/01/14 1225 04/01/14 1340  NA 157* 156* 156* 154* 153*  K 3.7 4.0 3.8 4.0 4.3  CL 122* 120* 120* 119* 122*  CO2 24 23 25 24 23   GLUCOSE 230* 233* 230* 245* 249*  BUN 29* 28*  27* 25* 26*  CREATININE 0.67 0.68 0.69 0.71 0.61  CALCIUM 8.5 8.3* 8.2* 8.4 7.7*   Liver Function Tests:  Recent Labs Lab 03/26/14 2049  AST 34  ALT 19  ALKPHOS 83  BILITOT 0.7  PROT 7.5  ALBUMIN 3.5    Recent Labs Lab 03/26/14 2049  LIPASE 45   No results found for this basename: AMMONIA,  in the last 168 hours CBC:  Recent Labs Lab 03/26/14 2049  03/28/14 0547 03/29/14 0542 03/30/14 0618 03/31/14 0556 03/31/14 1809  WBC 20.5*  < > 21.1* 14.4* 16.0* 11.0* 11.4*  NEUTROABS 17.3*  --   --   --   --   --  8.1*  HGB 17.0*  < > 14.0  12.5 12.2 12.7 13.0  HCT 49.5*  < > 41.4 37.9 36.7 39.5 39.1  MCV 88.1  < > 88.1 89.2 89.1 90.0 89.7  PLT 279  < > 216 207 213 216 211  < > = values in this interval not displayed. Cardiac Enzymes:  Recent Labs Lab 03/26/14 2049  TROPONINI <0.30   BNP (last 3 results)  Recent Labs  03/26/14 2049  PROBNP 231.2   CBG:  Recent Labs Lab 03/31/14 0729 03/31/14 1133 04/01/14 0326 04/01/14 0531 04/01/14 1224  GLUCAP 188* 242* 229* 216* 222*    Recent Results (from the past 240 hour(s))  URINE CULTURE     Status: None   Collection Time    03/26/14  9:57 PM      Result Value Ref Range Status   Specimen Description URINE, CATHETERIZED   Final   Special Requests NONE   Final   Culture  Setup Time     Final   Value: 03/27/2014 02:30     Performed at [facility] Count     Final   Value: >=100,000 COLONIES/ML     Performed at [facility]   Culture     Final   Value: KLEBSIELLA PNEUMONIAE     Performed at [facility]   Report Status 03/28/2014 FINAL   Final   Organism ID, Bacteria KLEBSIELLA PNEUMONIAE   Final  CLOSTRIDIUM DIFFICILE BY PCR     Status: None   Collection Time    03/26/14 10:10 PM      Result Value Ref Range Status   C difficile by pcr NEGATIVE  NEGATIVE Final  CULTURE, BLOOD (ROUTINE X 2)     Status: None   Collection Time    03/27/14  9:57 AM      Result Value Ref Range Status   Specimen Description BLOOD RIGHT ANTECUBITAL   Final   Special Requests     Final   Value: BOTTLES DRAWN AEROBIC AND ANAEROBIC AEB=9CC ANA=11CC   Culture NO GROWTH 5 DAYS   Final   Report Status 04/01/2014 FINAL   Final  CULTURE, BLOOD (ROUTINE X 2)     Status: None   Collection Time    03/27/14 10:03 AM      Result Value Ref Range Status   Specimen Description BLOOD LEFT HAND   Final   Special Requests BOTTLES DRAWN AEROBIC AND ANAEROBIC 10CC   Final   Culture NO GROWTH 5 DAYS   Final   Report Status 04/01/2014 FINAL   Final   CLOSTRIDIUM DIFFICILE BY PCR     Status: None   Collection Time    04/01/14  5:22 AM      Result Value Ref Range Status  C difficile by pcr NEGATIVE  NEGATIVE Final     Studies: Ct Head Wo Contrast  03/31/2014   CLINICAL DATA:  Altered mental status, recent stroke.  EXAM: CT HEAD WITHOUT CONTRAST  TECHNIQUE: Contiguous axial images were obtained from the base of the skull through the vertex without intravenous contrast.  COMPARISON:  MRI brain dated 03/27/2014.  CT head dated 03/26/2014.  FINDINGS: Evolving left ACA distribution infarct, predominantly involving the left medial frontal lobe.  No evidence of parenchymal hemorrhage or extra-axial fluid collection.  No mass lesion, mass effect, or midline shift.  Subcortical white matter and periventricular small vessel ischemic changes. Intracranial atherosclerosis.  Cerebral volume is within normal limits.  No ventriculomegaly.  The visualized paranasal sinuses are essentially clear. The mastoid air cells are unopacified.  No evidence of calvarial fracture.  IMPRESSION: Evolving left ACA distribution infarct.   Electronically Signed   By: Charline Bills M.D.   On: 03/31/2014 20:04   Mr Brain Wo Contrast  04/01/2014   CLINICAL DATA:  Fever.  Stroke.  EXAM: MRI HEAD WITHOUT CONTRAST  TECHNIQUE: Multiplanar, multiecho pulse sequences of the brain and surrounding structures were obtained without intravenous contrast.  COMPARISON:  Head CT 03/31/2014.  MRI 03/27/2014.  FINDINGS: There continues true restricted diffusion throughout the left anterior cerebral artery territory. The area of involvement shows mild swelling. There is mild mass effect with left-to-right shift of 2 mm. Petechial blood products are present within the region of infarction, without focal hematoma formation. I think there is been slight extension of the infarction into the splenium of the corpus callosum. Involvement of the body of the corpus callosum is unchanged. There is in a  punctate acute infarction seen within the left temporal lobe posteriorly. There is a punctate acute infarction now evident in the right parietal lobe. These suggest new micro embolic infarctions. No hydrocephalus. No extra-axial collection. No other new finding.  IMPRESSION: Evolutionary changes of the previously seen left anterior cerebral artery territory infarction. Petechial blood products within the region of infarction without frank hematoma. Swelling with left-to-right midline shift of 2 mm. Some extension of the infarction into the splenium of the corpus callosum.  Two new punctate acute infarctions, 1 within the left posterior temporal lobe in the other within the right parietal lobe. New development of these infarctions raises the possibility of micro embolic disease.   Electronically Signed   By: Paulina Fusi M.D.   On: 04/01/2014 11:15   Dg Chest Portable 1 View  03/31/2014   CLINICAL DATA:  Fever common weakness  EXAM: PORTABLE CHEST - 1 VIEW  COMPARISON:  Prior chest x-ray 03/29/2014  FINDINGS: Very low inspiratory volumes with stable bibasilar opacities likely atelectasis or scarring. Unchanged borderline cardiomegaly. Atherosclerotic calcifications again noted in the transverse aorta. No new effusion common pneumothorax, pulmonary edema or focal airspace consolidation. No acute osseous abnormality.  IMPRESSION: No significant interval change in the appearance of the chest. Persistent very low inspiratory volumes with bibasilar atelectasis.   Electronically Signed   By: Malachy Moan M.D.   On: 03/31/2014 18:38   Dg Abd Portable 1v  03/31/2014   CLINICAL DATA:  Abdominal pain  EXAM: PORTABLE ABDOMEN - 1 VIEW  COMPARISON:  Portable exam 2301 hr compared to 03/26/2014  FINDINGS: Gaseous distention of colon.  Retained contrast for stool in right colon.  No definite bowel wall thickening.  Additional air-filled loops of bowel in the a left lower quadrant are nonspecific, could represent mildly  dilated  small bowel or sigmoid colon.  No acute osseous findings or definite urinary tract calcification.  IMPRESSION: Air-filled loops of bowel throughout abdomen greater on left, majority colonic though a few bowel loops in the left lower quadrant are indeterminate for sigmoid colon versus small bowel.  No definite bowel wall thickening.  If patient has persistent symptoms, consider CT imaging to exclude small bowel dilatation.   Electronically Signed   By: Ulyses SouthwardMark  Boles M.D.   On: 03/31/2014 23:41    Scheduled Meds: . aspirin  300 mg Rectal Daily  . ceFEPime (MAXIPIME) IV  2 g Intravenous Q12H  . heparin  5,000 Units Subcutaneous 3 times per day  . insulin aspart  0-15 Units Subcutaneous Q6H  . insulin aspart  0-5 Units Subcutaneous QHS  . ipratropium-albuterol  3 mL Nebulization TID  . vancomycin  1,250 mg Intravenous Q12H   Continuous Infusions: . dextrose 50 mL/hr at 04/01/14 0401      Time spent: 35 minutes    Keller Bounds  [facility] Pager [phone]. If 7PM-7AM, please contact night-coverage at [email or web address], password [email or web address] 04/01/2014, 4:01 PM  LOS: 1 day

## 2014-04-01 NOTE — Evaluation (Signed)
Clinical/Bedside Swallow Evaluation Patient Details  Name: Diane Rangel MRN: [id number] Date of Birth: 11-07-1934  Today's Date: 04/01/2014 Time: [id number] SLP Time Calculation (min): 20 min  Past Medical History:  Past Medical History  Diagnosis Date  . Hypertension   . Diabetes mellitus without complication   . CVA (cerebral infarction)   . UTI (lower urinary tract infection)     klebsiella  . Stroke    Past Surgical History:  Past Surgical History  Procedure Laterality Date  . Ankle surgery     HPI:  Pt is a 80 year-old female with pmhx of HTN, diabetes mellitus, recent left ACA stroke and dysphagia.  She was brought in from a nursing home ([facility] per family) where she was recently admitted to from the hospital.  She was started on an oral diet with dysphagia monitoring at the nursing home.  Family reports one incident of pt receiving thin liquids via cup d/t lack of communication that pt was on nectar-thickened liquids.  Pt was at her baseline with dependence with ADL's.  She recently had a MBS indicating need for nectar-thickened liquids and a Dysphagia 1 diet.    Assessment / Plan / Recommendation Clinical Impression  Pt with s/s of aspiration with nectar-thickened liquids including delayed throat clearing and wet vocal quality at bedside; pt exhibits impaired oral phase of swallow with all consistencies given including decreased movement/coordination and prolonged oral transit, impairment of pharyngeal phase of swallow include suspected delayed swallow and multiple swallows with honey-thickened liquids; Pt had recent MBS on 03/30/14 indicating need for nectar-thickened liquids/Dysphagia 1 diet, but seems to have had a change in status since this was completed and honey-thickened liquids are indicated at this time d/t s/s of aspiration with nectar-thickened liquids; repeat MBS may be indicated prior to discharge.  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     Aspiration Risk  Moderate (without swallow precautions)    Diet Recommendation Dysphagia 1 (Puree);Honey-thick liquid   Liquid Administration via: Cup;Spoon Medication Administration: Crushed with puree Supervision: Full supervision/cueing for compensatory strategies;Staff to assist with self feeding Compensations: Slow rate;Small sips/bites;Check for pocketing Postural Changes and/or Swallow Maneuvers: Out of bed for meals;Seated upright 90 degrees;Upright 30-60 min after meal    Other  Recommendations Recommended Consults:  (possible repeat MBS during hospitalization prn); SLE  Oral Care Recommendations: Oral care BID Other Recommendations: Order thickener from pharmacy;Clarify dietary restrictions   Follow Up Recommendations  Skilled Nursing facility    Frequency and Duration min 2x/week  2 weeks   Pertinent Vitals/Pain WDL    SLP Swallow Goals  See Care plan   Swallow Study Prior Functional Status   Dyphagia 1/nectar-thickened liquids per MBS    General Date of Onset: 03/26/14 HPI: Pt is a 80 year-old female with pmhx of HTN, diabetes mellitus, recent left ACA stroke and dysphagia.  She was brought in from a nursing home ([facility] per family) where she was recently admitted to from the hospital.  She was started on an oral diet with dysphagia monitoring at the nursing home.  Family reports one incident of pt receiving thin liquids via cup d/t lack of communication that pt was on nectar-thickened liquids.  Pt was at her baseline with dependence with ADL's.  She recently had a MBS indicating need for nectar-thickened liquids and a Dysphagia 1 diet.  Pt is having difficulty communicating  with expressive fluent aphasia present during conversational tasks Type of Study: Bedside swallow evaluation Previous Swallow Assessment: MBS on 03/30/14 indicating need for  Dysphagia 1 diet with nectar-thickened liquids Diet Prior to this Study: Nectar-thick liquids;Dysphagia 1 (puree) Temperature Spikes Noted: No Respiratory Status: Nasal cannula Behavior/Cognition: Alert;Cooperative;Impulsive;Requires cueing Oral Cavity - Dentition: Poor condition;Missing dentition Self-Feeding Abilities: Needs assist Patient Positioning: Upright in bed Baseline Vocal Quality: Low vocal intensity;Breathy Volitional Cough: Weak Volitional Swallow: Unable to elicit    Oral/Motor/Sensory Function Overall Oral Motor/Sensory Function: Impaired Labial ROM: Reduced right Labial Symmetry: Abnormal symmetry right Labial Strength: Reduced Labial Sensation: Reduced Lingual ROM: Reduced right Lingual Symmetry: Abnormal symmetry right Lingual Strength: Reduced Lingual Sensation: Reduced Facial ROM: Reduced right Facial Symmetry: Right droop Facial Strength: Reduced Facial Sensation: Reduced Velum: Within Functional Limits Mandible: Within Functional Limits   Ice Chips Ice chips: Not tested   Thin Liquid Thin Liquid: Not tested    Nectar Thick Nectar Thick Liquid: Impaired Presentation: Spoon;Cup Oral Phase Impairments: Reduced labial seal;Reduced lingual movement/coordination Pharyngeal Phase Impairments: Suspected delayed Swallow;Wet Vocal Quality;Throat Clearing - Delayed   Honey Thick Honey Thick Liquid: Impaired Presentation: Cup Pharyngeal Phase Impairments: Multiple swallows;Suspected delayed Swallow   Puree Puree: Impaired Presentation: Spoon Oral Phase Impairments: Reduced lingual movement/coordination Oral Phase Functional Implications: Prolonged oral transit Pharyngeal Phase Impairments: Suspected delayed Swallow   Solid       Solid: Not tested       Rod MaePatricia A Breanna Mcdaniel, M.S., CCC-SLP 04/01/2014,3:47 PM

## 2014-04-01 NOTE — Consult Note (Signed)
Referring Physician: Dr Gonzella Lexhungel    Chief Complaint: Stroke  HPI: Diane Rangel is a 80 y.o. female recently admitted 03/26/2014 to 03/31/2014 with severe diarrhea, syncope, and dehydration. A CT of the head on 4/19 showed no acute changes. An MRI/MRA was performed on 03/27/2014 secondary to mental status changes.  The MRI revealed an acute moderate to large sized medial left frontal lobe non-hemorrhagic infarct extending into the parietal lobe in the left anterior cerebral artery distribution. She was also noted to have remote small basal ganglia and thalamic infarcts. The MRA showed an abrupt cut off of flow in the proximal A2 segment of the left anterior cerebral artery consistent with the patient's infarct. An occluded right vertebral artery was also identified.   Dr Gerilyn Pilgrimoonquah saw the patient in consultation on 03/28/2014 and noted right hemiparesis as well as encephalopathy. The patient had been placed on aspirin 81 mg daily for secondary stroke prophylaxis. Her encephalopathy was felt to be multifactorial in the setting of a UTI, sepsis, dehydration, and stroke. She was eventually discharged to a skilled nursing facility on 03/31/2014 only to be [facility] that same day with fever and increasing lethargy. An MRI was repeated on 04/01/2014 which showed evolutionary changes of her left anterior cerebral artery territory infarct with a left to right midline shift to 2 mm. There were also 2 new punctate acute infarcts. Neurology was asked to consult.  Date last known well: Date: 03/26/2014 Time last known well: Unable to determine tPA Given: No: late presentation.  Past Medical History  Diagnosis Date  . Hypertension   . Diabetes mellitus without complication   . CVA (cerebral infarction)   . UTI (lower urinary tract infection)     klebsiella  . Stroke     Past Surgical History  Procedure Laterality Date  . Ankle surgery      Family History: No family history of strokes.  Social  History:  reports that she has never smoked. She does not have any smokeless tobacco history on file. She reports that she does not drink alcohol or use illicit drugs.  Allergies: No Known Allergies  Medications:  Scheduled: . aspirin  300 mg Rectal Daily  . ceFEPime (MAXIPIME) IV  2 g Intravenous Q12H  . heparin  5,000 Units Subcutaneous 3 times per day  . insulin aspart  0-15 Units Subcutaneous Q6H  . insulin aspart  0-5 Units Subcutaneous QHS  . ipratropium-albuterol  3 mL Nebulization TID  . vancomycin  1,250 mg Intravenous Q12H    ROS: Unobtainable this time secondary to aphasia  Physical Examination: Blood pressure 174/96, pulse 103, temperature 98.4 F (36.9 C), temperature source Oral, resp. rate 22, weight 261 lb 11 oz (118.7 kg), SpO2 99.00%.  Neurologic Examination:  Mental Status: The patient is lethargic. She follows only some commands. She is aphasic and unable to answer most questions. Cranial Nerves: II: Discs not visualized; Visual fields grossly normal, pupils equal, round, and sluggish. III,IV, VI: ptosis not present - the patient was unable to follow commands for eom testing. V,VII: right lower facial droop, facial light touch sensation normal bilaterally VIII: hearing normal bilaterally IX,X: gag reflex present XI: bilateral shoulder shrug could not be tested XII: the patient would not extend her tongue. Motor: Right : Upper extremity   0/5    Left:     Upper extremity   3-4/5 (generalized weakness on the left)  Lower extremity   0/5     Lower extremity  2/5 Tone and bulk:normal tone throughout; no atrophy noted Sensory: light touch intact throughout, bilaterally Deep Tendon Reflexes: lower extremity reflexes absent. Upper extremities 2+ bilaterally. Plantars: Right: upgoing   Left: downgoing Cerebellar: Unable to test Gait: unable to ambulate   Laboratory Studies:  Basic Metabolic Panel:  Recent Labs Lab 03/31/14 1809 04/01/14 0108  04/01/14 0515 04/01/14 0725 04/01/14 0830  NA 154* 156* 157* 156* 156*  K 4.0 3.8 3.7 4.0 3.8  CL 119* 122* 122* 120* 120*  CO2 24 23 24 23 25   GLUCOSE 211* 248* 230* 233* 230*  BUN 38* 32* 29* 28* 27*  CREATININE 0.89 0.75 0.67 0.68 0.69  CALCIUM 8.3* 8.3* 8.5 8.3* 8.2*    Liver Function Tests:  Recent Labs Lab 03/26/14 2049  AST 34  ALT 19  ALKPHOS 83  BILITOT 0.7  PROT 7.5  ALBUMIN 3.5    Recent Labs Lab 03/26/14 2049  LIPASE 45   No results found for this basename: AMMONIA,  in the last 168 hours  CBC:  Recent Labs Lab 03/26/14 2049  03/28/14 0547 03/29/14 0542 03/30/14 0618 03/31/14 0556 03/31/14 1809  WBC 20.5*  < > 21.1* 14.4* 16.0* 11.0* 11.4*  NEUTROABS 17.3*  --   --   --   --   --  8.1*  HGB 17.0*  < > 14.0 12.5 12.2 12.7 13.0  HCT 49.5*  < > 41.4 37.9 36.7 39.5 39.1  MCV 88.1  < > 88.1 89.2 89.1 90.0 89.7  PLT 279  < > 216 207 213 216 211  < > = values in this interval not displayed.  Cardiac Enzymes:  Recent Labs Lab 03/26/14 2049  TROPONINI <0.30    BNP: No components found with this basename: POCBNP,   CBG:  Recent Labs Lab 03/30/14 1841 03/31/14 0729 03/31/14 1133 04/01/14 0326 04/01/14 0531  GLUCAP 318* 188* 242* 229* 216*    Microbiology: Results for orders placed during the hospital encounter of 03/31/14  CLOSTRIDIUM DIFFICILE BY PCR     Status: None   Collection Time    04/01/14  5:22 AM      Result Value Ref Range Status   C difficile by pcr NEGATIVE  NEGATIVE Final    Coagulation Studies: No results found for this basename: LABPROT, INR,  in the last 72 hours  Urinalysis:  Recent Labs Lab 03/26/14 2157 03/31/14 2139  COLORURINE AMBER* YELLOW  LABSPEC 1.015 1.024  PHURINE 8.0 5.5  GLUCOSEU NEGATIVE 100*  HGBUR LARGE* TRACE*  BILIRUBINUR SMALL* NEGATIVE  KETONESUR NEGATIVE NEGATIVE  PROTEINUR 30* NEGATIVE  UROBILINOGEN 4.0* 1.0  NITRITE NEGATIVE NEGATIVE  LEUKOCYTESUR SMALL* NEGATIVE    Lipid  Panel:    Component Value Date/Time   CHOL 153 03/27/2014 0626   TRIG 144 03/27/2014 0626   HDL 33* 03/27/2014 0626   CHOLHDL 4.6 03/27/2014 0626   VLDL 29 03/27/2014 0626   LDLCALC 91 03/27/2014 0626    HgbA1C:  Lab Results  Component Value Date   HGBA1C 6.8* 03/27/2014    Urine Drug Screen:     Component Value Date/Time   LABOPIA NONE DETECTED 03/26/2014 2157   COCAINSCRNUR NONE DETECTED 03/26/2014 2157   LABBENZ NONE DETECTED 03/26/2014 2157   AMPHETMU NONE DETECTED 03/26/2014 2157   THCU NONE DETECTED 03/26/2014 2157   LABBARB NONE DETECTED 03/26/2014 2157    Alcohol Level: No results found for this basename: ETH,  in the last 168 hours  Other results: EKG: sinus tachycardia - rate  104 beats per minute.  Imaging:  Ct Head Wo Contrast 03/31/2014    Evolving left ACA distribution infarct.     Mr Brain Wo Contrast 04/01/2014    Evolutionary changes of the previously seen left anterior cerebral artery territory infarction. Petechial blood products within the region of infarction without frank hematoma. Swelling with left-to-right midline shift of 2 mm. Some extension of the infarction into the splenium of the corpus callosum.  Two new punctate acute infarctions, 1 within the left posterior temporal lobe in the other within the right parietal lobe.   Dg Chest Portable 1 View 03/31/2014    No significant interval change in the appearance of the chest. Persistent very low inspiratory volumes with bibasilar atelectasis.     Dg Abd Portable 1v 03/31/2014    Air-filled loops of bowel throughout abdomen greater on left, majority colonic though a few bowel loops in the left lower quadrant are indeterminate for sigmoid colon versus small bowel.  No definite bowel wall thickening.  If patient has persistent symptoms, consider CT imaging to exclude small bowel dilatation.       Assessment: 80 y.o. female with recent moderate to large size medial left frontal lobe infarct extending into the  parietal lobe in the left anterior cerebral artery distribution as well as small basal ganglia and thalamic infarcts by MRI 03/27/2014. A repeat MRI today, 04/01/2014, shows a 2 mm left to right midline shift as well as 2 new punctate acute infarcts.  New development of these infarctions raises the possibility of micro embolic disease but no definitive source identified in recent stroke work up. Wonder about the possibility of occult atrial fibrillation. She had fever but endocarditis with septic embolic appears to be less likely. In any case, she has ongoing metabolic derangements, UTI, and new areas of small stroke contributing to a multifactorial encephalopathy. Continue current  causing antiplatelet therapy pending further recommendations by the stroke team in am.     Stroke Risk Factors - diabetes mellitus, hypertension and previous strokes  Plan: 1. HgbA1c - 6.8 on 03/27/2014   Cholesterol 153; LDL 91 on 03/27/2014 2. MRI, MRA - see above 3. PT consult, OT consult, Speech consult 4. Echocardiogram - performed 03/27/2014 5. Carotid dopplers - performed 03/27/2014 6. Prophylactic therapy-Antiplatelet med: Aspirin - dose 300mg  rectal suppository. 7. Risk factor modification 8. Telemetry monitoring 9. Frequent neuro checks   Hassel Neth [facility] Pager [phone] 04/01/2014, 1:08 PM  Patient seen and examined together with physician assistant and I concur with the assessment and plan.  Wyatt Portela, MD

## 2014-04-02 DIAGNOSIS — I6350 Cerebral infarction due to unspecified occlusion or stenosis of unspecified cerebral artery: Secondary | ICD-10-CM

## 2014-04-02 LAB — BASIC METABOLIC PANEL
BUN: 26 mg/dL — ABNORMAL HIGH (ref 6–23)
BUN: 27 mg/dL — ABNORMAL HIGH (ref 6–23)
BUN: 27 mg/dL — ABNORMAL HIGH (ref 6–23)
BUN: 29 mg/dL — AB (ref 6–23)
CALCIUM: 7.9 mg/dL — AB (ref 8.4–10.5)
CALCIUM: 8.3 mg/dL — AB (ref 8.4–10.5)
CALCIUM: 8.2 mg/dL — AB (ref 8.4–10.5)
CHLORIDE: 119 meq/L — AB (ref 96–112)
CO2: 24 mEq/L (ref 19–32)
CO2: 25 mEq/L (ref 19–32)
CO2: 23 meq/L (ref 19–32)
CO2: 24 meq/L (ref 19–32)
CREATININE: 0.69 mg/dL (ref 0.50–1.10)
Calcium: 8.3 mg/dL — ABNORMAL LOW (ref 8.4–10.5)
Chloride: 119 mEq/L — ABNORMAL HIGH (ref 96–112)
Chloride: 122 mEq/L — ABNORMAL HIGH (ref 96–112)
Chloride: 120 mEq/L — ABNORMAL HIGH (ref 96–112)
Creatinine, Ser: 0.73 mg/dL (ref 0.50–1.10)
Creatinine, Ser: 0.81 mg/dL (ref 0.50–1.10)
Creatinine, Ser: 0.78 mg/dL (ref 0.50–1.10)
GFR calc Af Amer: >90 mL/min (ref >90)
GFR calc non Af Amer: 80 mL/min — ABNORMAL LOW (ref >90)
GFR calc non Af Amer: 78 mL/min — ABNORMAL LOW (ref >90)
GFR, EST AFRICAN AMERICAN: 79 mL/min — AB (ref >90)
GFR, EST NON AFRICAN AMERICAN: 81 mL/min — AB (ref >90)
GFR, EST NON AFRICAN AMERICAN: 68 mL/min — AB (ref >90)
GLUCOSE: 241 mg/dL — AB (ref 70–99)
Glucose, Bld: 314 mg/dL — ABNORMAL HIGH (ref 70–99)
Glucose, Bld: 271 mg/dL — ABNORMAL HIGH (ref 70–99)
Glucose, Bld: 386 mg/dL — ABNORMAL HIGH (ref 70–99)
POTASSIUM: 4 meq/L (ref 3.7–5.3)
POTASSIUM: 3.9 meq/L (ref 3.7–5.3)
POTASSIUM: 3.9 meq/L (ref 3.7–5.3)
Potassium: 3.8 mEq/L (ref 3.7–5.3)
SODIUM: 155 meq/L — AB (ref 137–147)
Sodium: 155 mEq/L — ABNORMAL HIGH (ref 137–147)
Sodium: 159 mEq/L — ABNORMAL HIGH (ref 137–147)
Sodium: 154 mEq/L — ABNORMAL HIGH (ref 137–147)

## 2014-04-02 LAB — GLUCOSE, CAPILLARY
GLUCOSE-CAPILLARY: 277 mg/dL — AB (ref 70–99)
GLUCOSE-CAPILLARY: 298 mg/dL — AB (ref 70–99)
Glucose-Capillary: 234 mg/dL — ABNORMAL HIGH (ref 70–99)
Glucose-Capillary: 341 mg/dL — ABNORMAL HIGH (ref 70–99)

## 2014-04-02 LAB — URINE CULTURE
COLONY COUNT: NO GROWTH
CULTURE: NO GROWTH

## 2014-04-02 LAB — LEGIONELLA ANTIGEN, URINE: Legionella Antigen, Urine: NEGATIVE

## 2014-04-02 MED ORDER — FREE WATER
350.0000 mL | Freq: Four times a day (QID) | Status: DC
  Administered 2014-04-03: 350 mL via ORAL

## 2014-04-02 MED ORDER — INSULIN ASPART 100 UNIT/ML ~~LOC~~ SOLN
0.0000 [IU] | Freq: Four times a day (QID) | SUBCUTANEOUS | Status: DC
  Administered 2014-04-02: 11 [IU] via SUBCUTANEOUS
  Administered 2014-04-03: 15 [IU] via SUBCUTANEOUS
  Administered 2014-04-03: 8 [IU] via SUBCUTANEOUS

## 2014-04-02 MED ORDER — INSULIN GLARGINE 100 UNIT/ML ~~LOC~~ SOLN
10.0000 [IU] | Freq: Every day | SUBCUTANEOUS | Status: DC
  Administered 2014-04-02: 10 [IU] via SUBCUTANEOUS
  Filled 2014-04-02: qty 0.1

## 2014-04-02 MED ORDER — FREE WATER: 350.0000 mL | Freq: Four times a day (QID) | Status: DC

## 2014-04-02 MED ORDER — PANTOPRAZOLE SODIUM 40 MG PO TBEC
40.0000 mg | DELAYED_RELEASE_TABLET | Freq: Every day | ORAL | Status: DC
  Administered 2014-04-02 – 2014-04-03 (×2): 40 mg via ORAL
  Filled 2014-04-02 (×2): qty 1

## 2014-04-02 NOTE — Progress Notes (Addendum)
[facility] PROGRESS NOTE  Diane Rangel VWU:[id number] DOB: [birth year]Dellie Catholic9 DOA: 03/31/2014 PCP: Ernestine ConradBLUTH, KIRK, MD   Brief narrative 80 y/o female with recent left ACA stroke with residual right hemiplegia and mental status changes. She was discharged from [facility] to SNF only yesterday. Prior to discharge she had diarrhea and hypernatremia and required rectal tube placement. Barely after arriving to SNF she developed fever and increasing lethargy and [facility] Head CT showed evolution of left ACA stoke. Her hypernatremia worsened to 157. MRI brain repeated this am showed evolution of left ACA infarct with 2mm left to right midline  shift and 2 new punctate infarcts over bilateral temporal lobes.   Assessment/Plan: Acute recurrent CVA Evolution of recent left ACA infarct and 2 new infarct on MRI brain ( left temporal and rt parietal ) suggestive of embolic stroke.. Has 2 mm midline shift as well and  Encephalopathy worsened. Seen by swallow eval and recommended dys level 1 diet. Continue ASA . Continue with neuro checks. Recently had carotids and 2D echo so need not repeat. Neurology doesnot recommend further testing. Allow permissive HTN. PT/OT eval Monitor on tele Will place on oral decadron given midline shift.  Hypernatremia  Patient has significant hypernatremia. Possibly from dehydration.  Was admitted to [facility] with normal sodium levels. Elevated serum osm. Urine Na normal. Follow urine osm normal . Placed on D5W. Added free water.  Monitor closely.  Fever and shortness of breath Patient afebrile here. resp status stable. Reportedly c/o abdomen pain in ED . abd x ray was unremarkable. Possibly has some bronchitis or early pneumonia. Placed on empiric vanco and cefepime. cx so far negative. Will narrow abx in am  if cx negative and remains afebrile   Diabetes mellitus  Patient on D5 drip. Also on decadron which will increase the blood glucose. Add 10 units bedtime insulin. continue  sliding scale   UTI  cx growing klebsiella. On levaquin recently . Now empirically covered with broad abx  Gastroenteritis with diarrhea  now resolved. Remove rectal tube   DVT Prophylaxis:  Sq heparin       Code Status: DNR Family Communication: daughter and granddaughter  at bedside Disposition Plan: SNF   Consultants:  neurology  Procedures:  MRI brian  Antibiotics:  none  HPI/Subjective: Patient responds to simple commands but has significant right hemiplegia and some left sided weakness Objective: Filed Vitals:   04/02/14 0548  BP: 174/74  Pulse: 100  Temp: 99.4 F (37.4 C)  Resp: 20    Intake/Output Summary (Last 24 hours) at 04/02/14 1118 Last data filed at 04/02/14 0838  Gross per 24 hour  Intake    240 ml  Output      0 ml  Net    240 ml   Filed Weights   03/31/14 2300 04/01/14 0000 04/02/14 0500  Weight: 120 kg (264 lb 8.8 oz) 118.7 kg (261 lb 11 oz) 118.7 kg (261 lb 11 oz)    Exam:   General:  Elderly female sitting up in bed , has flattened affect  HEENT: no pallor, moist oral mucosa, right  Facial droop  Chest: clear b/l, no added sounds  CVS: NS1&S2, no MRG  Abd: soft, NT, ND, BS+, rectal tube and foley in place Ext: warm, no edema CNS: AAOX0, right facial droop, 3/5 power over LUE, responds to simple commands but non comprehensible.   Data Reviewed: Basic Metabolic Panel:  Recent Labs Lab 04/01/14 2015 04/01/14 2238 04/02/14 0042 04/02/14  7169 04/02/14 0811  NA 154* 154* 155* 159* 155*  K 3.7 4.0 3.8 4.0 3.9  CL 118* 118* 119* 122* 120*  CO2 23 22 24 24 25   GLUCOSE 259* 292* 314* 271* 241*  BUN 24* 24* 26* 27* 27*  CREATININE 0.63 0.63 0.69 0.73 0.81  CALCIUM 8.4 8.5 7.9* 8.3* 8.2*   Liver Function Tests:  Recent Labs Lab 03/26/14 2049  AST 34  ALT 19  ALKPHOS 83  BILITOT 0.7  PROT 7.5  ALBUMIN 3.5    Recent Labs Lab 03/26/14 2049  LIPASE 45   No results found for this basename: AMMONIA,  in  the last 168 hours CBC:  Recent Labs Lab 03/26/14 2049  03/28/14 0547 03/29/14 0542 03/30/14 0618 03/31/14 0556 03/31/14 1809  WBC 20.5*  < > 21.1* 14.4* 16.0* 11.0* 11.4*  NEUTROABS 17.3*  --   --   --   --   --  8.1*  HGB 17.0*  < > 14.0 12.5 12.2 12.7 13.0  HCT 49.5*  < > 41.4 37.9 36.7 39.5 39.1  MCV 88.1  < > 88.1 89.2 89.1 90.0 89.7  PLT 279  < > 216 207 213 216 211  < > = values in this interval not displayed. Cardiac Enzymes:  Recent Labs Lab 03/26/14 2049  TROPONINI <0.30   BNP (last 3 results)  Recent Labs  03/26/14 2049  PROBNP 231.2   CBG:  Recent Labs Lab 04/01/14 1224 04/01/14 1649 04/02/14 0001 04/02/14 0009 04/02/14 0527  GLUCAP 222* 230* 277* 298* 234*    Recent Results (from the past 240 hour(s))  URINE CULTURE     Status: None   Collection Time    03/26/14  9:57 PM      Result Value Ref Range Status   Specimen Description URINE, CATHETERIZED   Final   Special Requests NONE   Final   Culture  Setup Time     Final   Value: 03/27/2014 02:30     Performed at [facility] Count     Final   Value: >=100,000 COLONIES/ML     Performed at [facility]   Culture     Final   Value: KLEBSIELLA PNEUMONIAE     Performed at [facility]   Report Status 03/28/2014 FINAL   Final   Organism ID, Bacteria KLEBSIELLA PNEUMONIAE   Final  CLOSTRIDIUM DIFFICILE BY PCR     Status: None   Collection Time    03/26/14 10:10 PM      Result Value Ref Range Status   C difficile by pcr NEGATIVE  NEGATIVE Final  CULTURE, BLOOD (ROUTINE X 2)     Status: None   Collection Time    03/27/14  9:57 AM      Result Value Ref Range Status   Specimen Description BLOOD RIGHT ANTECUBITAL   Final   Special Requests     Final   Value: BOTTLES DRAWN AEROBIC AND ANAEROBIC AEB=9CC ANA=11CC   Culture NO GROWTH 5 DAYS   Final   Report Status 04/01/2014 FINAL   Final  CULTURE, BLOOD (ROUTINE X 2)     Status: None   Collection Time     03/27/14 10:03 AM      Result Value Ref Range Status   Specimen Description BLOOD LEFT HAND   Final   Special Requests BOTTLES DRAWN AEROBIC AND ANAEROBIC 10CC   Final   Culture NO GROWTH 5 DAYS  Final   Report Status 04/01/2014 FINAL   Final  URINE CULTURE     Status: None   Collection Time    03/31/14  9:39 PM      Result Value Ref Range Status   Specimen Description URINE, CATHETERIZED   Final   Special Requests NONE   Final   Culture  Setup Time     Final   Value: 03/31/2014 23:51     Performed at [facility] Count     Final   Value: NO GROWTH     Performed at [facility]   Culture     Final   Value: NO GROWTH     Performed at [facility]   Report Status 04/02/2014 FINAL   Final  CLOSTRIDIUM DIFFICILE BY PCR     Status: None   Collection Time    04/01/14  5:22 AM      Result Value Ref Range Status   C difficile by pcr NEGATIVE  NEGATIVE Final     Studies: Ct Head Wo Contrast  03/31/2014   CLINICAL DATA:  Altered mental status, recent stroke.  EXAM: CT HEAD WITHOUT CONTRAST  TECHNIQUE: Contiguous axial images were obtained from the base of the skull through the vertex without intravenous contrast.  COMPARISON:  MRI brain dated 03/27/2014.  CT head dated 03/26/2014.  FINDINGS: Evolving left ACA distribution infarct, predominantly involving the left medial frontal lobe.  No evidence of parenchymal hemorrhage or extra-axial fluid collection.  No mass lesion, mass effect, or midline shift.  Subcortical white matter and periventricular small vessel ischemic changes. Intracranial atherosclerosis.  Cerebral volume is within normal limits.  No ventriculomegaly.  The visualized paranasal sinuses are essentially clear. The mastoid air cells are unopacified.  No evidence of calvarial fracture.  IMPRESSION: Evolving left ACA distribution infarct.   Electronically Signed   By: Charline Bills M.D.   On: 03/31/2014 20:04   Mr Brain Wo Contrast  04/01/2014    CLINICAL DATA:  Fever.  Stroke.  EXAM: MRI HEAD WITHOUT CONTRAST  TECHNIQUE: Multiplanar, multiecho pulse sequences of the brain and surrounding structures were obtained without intravenous contrast.  COMPARISON:  Head CT 03/31/2014.  MRI 03/27/2014.  FINDINGS: There continues true restricted diffusion throughout the left anterior cerebral artery territory. The area of involvement shows mild swelling. There is mild mass effect with left-to-right shift of 2 mm. Petechial blood products are present within the region of infarction, without focal hematoma formation. I think there is been slight extension of the infarction into the splenium of the corpus callosum. Involvement of the body of the corpus callosum is unchanged. There is in a punctate acute infarction seen within the left temporal lobe posteriorly. There is a punctate acute infarction now evident in the right parietal lobe. These suggest new micro embolic infarctions. No hydrocephalus. No extra-axial collection. No other new finding.  IMPRESSION: Evolutionary changes of the previously seen left anterior cerebral artery territory infarction. Petechial blood products within the region of infarction without frank hematoma. Swelling with left-to-right midline shift of 2 mm. Some extension of the infarction into the splenium of the corpus callosum.  Two new punctate acute infarctions, 1 within the left posterior temporal lobe in the other within the right parietal lobe. New development of these infarctions raises the possibility of micro embolic disease.   Electronically Signed   By: Paulina Fusi M.D.   On: 04/01/2014 11:15   Dg Chest Portable 1 View  03/31/2014   CLINICAL DATA:  Fever common weakness  EXAM: PORTABLE CHEST - 1 VIEW  COMPARISON:  Prior chest x-ray 03/29/2014  FINDINGS: Very low inspiratory volumes with stable bibasilar opacities likely atelectasis or scarring. Unchanged borderline cardiomegaly. Atherosclerotic calcifications again noted in the  transverse aorta. No new effusion common pneumothorax, pulmonary edema or focal airspace consolidation. No acute osseous abnormality.  IMPRESSION: No significant interval change in the appearance of the chest. Persistent very low inspiratory volumes with bibasilar atelectasis.   Electronically Signed   By: Malachy MoanHeath  McCullough M.D.   On: 03/31/2014 18:38   Dg Abd Portable 1v  03/31/2014   CLINICAL DATA:  Abdominal pain  EXAM: PORTABLE ABDOMEN - 1 VIEW  COMPARISON:  Portable exam 2301 hr compared to 03/26/2014  FINDINGS: Gaseous distention of colon.  Retained contrast for stool in right colon.  No definite bowel wall thickening.  Additional air-filled loops of bowel in the a left lower quadrant are nonspecific, could represent mildly dilated small bowel or sigmoid colon.  No acute osseous findings or definite urinary tract calcification.  IMPRESSION: Air-filled loops of bowel throughout abdomen greater on left, majority colonic though a few bowel loops in the left lower quadrant are indeterminate for sigmoid colon versus small bowel.  No definite bowel wall thickening.  If patient has persistent symptoms, consider CT imaging to exclude small bowel dilatation.   Electronically Signed   By: Ulyses SouthwardMark  Boles M.D.   On: 03/31/2014 23:41    Scheduled Meds: . aspirin  300 mg Rectal Daily  . ceFEPime (MAXIPIME) IV  2 g Intravenous Q12H  . dexamethasone  4 mg Oral 3 times per day  . free water  350 mL Per Tube Q6H  . heparin  5,000 Units Subcutaneous 3 times per day  . insulin aspart  0-15 Units Subcutaneous Q6H  . ipratropium-albuterol  3 mL Nebulization TID  . vancomycin  1,250 mg Intravenous Q12H   Continuous Infusions: . dextrose 50 mL/hr at 04/01/14 0401      Time spent: 25 minutes    Mead Slane  [facility] Pager [phone]. If 7PM-7AM, please contact night-coverage at [email or web address], password [email or web address] 04/02/2014, 11:18 AM  LOS: 2 days

## 2014-04-02 NOTE — Progress Notes (Signed)
Carotid Doppler - Bilateral carotid bifurcation and proximal ICA plaque, resulting in less than 50% diameter stenosis. The exam does not exclude plaque ulceration or embolization. Continued surveillance recommended.  Delton Seeavid Khamari Sheehan PA-C [facility] Pager [phone] 04/02/2014, 2:23 PM

## 2014-04-02 NOTE — Progress Notes (Signed)
Stroke Team Progress Note  HISTORY Diane Catholicnez P Pellecchia is a 80 y.o. female recently admitted 03/26/2014 to 03/31/2014 with severe diarrhea, syncope, and dehydration. A CT of the head on 4/19 showed no acute changes. An MRI/MRA was performed on 03/27/2014 secondary to mental status changes.  The MRI revealed an acute moderate to large sized medial left frontal lobe non-hemorrhagic infarct extending into the parietal lobe in the left anterior cerebral artery distribution. She was also noted to have remote small basal ganglia and thalamic infarcts. The MRA showed an abrupt cut off of flow in the proximal A2 segment of the left anterior cerebral artery consistent with the patient's infarct. An occluded right vertebral artery was also identified.  Dr Gerilyn Pilgrimoonquah saw the patient in consultation on 03/28/2014 and noted right hemiparesis as well as encephalopathy. The patient had been placed on aspirin 81 mg daily for secondary stroke prophylaxis. Her encephalopathy was felt to be multifactorial in the setting of a UTI, sepsis, dehydration, and stroke. She was eventually discharged to a skilled nursing facility on 03/31/2014 only [facility] with fever and increasing lethargy. An MRI was repeated on 04/01/2014 which showed evolutionary changes of her left anterior cerebral artery territory infarct with a left to right midline shift to 2 mm. There were also 2 new punctate acute infarcts. Neurology was asked to consult.   Date last known well: Date: 03/26/2014  Time last known well: Unable to determine  tPA Given: No: late presentation.   SUBJECTIVE Multiple family members present. Long discussion regarding the patient's status, prognosis, and eventual discharge plans.  OBJECTIVE Most recent Vital Signs: Filed Vitals:   04/01/14 2125 04/02/14 0218 04/02/14 0500 04/02/14 0548  BP: 136/95 157/68  174/74  Pulse: 106 106  100  Temp: 97.5 F (36.4 C) 97.8 F (36.6 C)  99.4 F (37.4 C)   TempSrc: Oral Oral  Oral  Resp: 20 20  20   Weight:   261 lb 11 oz (118.7 kg)   SpO2: 98% 99%  95%   CBG (last 3)   Recent Labs  04/02/14 0001 04/02/14 0009 04/02/14 0527  GLUCAP 277* 298* 234*    IV Fluid Intake:   . dextrose 50 mL/hr at 04/01/14 0401    MEDICATIONS  . aspirin  300 mg Rectal Daily  . ceFEPime (MAXIPIME) IV  2 g Intravenous Q12H  . dexamethasone  4 mg Oral 3 times per day  . heparin  5,000 Units Subcutaneous 3 times per day  . insulin aspart  0-15 Units Subcutaneous Q6H  . ipratropium-albuterol  3 mL Nebulization TID  . vancomycin  1,250 mg Intravenous Q12H   PRN:  food thickener, hydrALAZINE  Diet:  Dysphagia 1 honey thick liquids Activity:  Bathroom privileges with assistance DVT Prophylaxis:  Subcutaneous heparin  CLINICALLY SIGNIFICANT STUDIES Basic Metabolic Panel:  Recent Labs Lab 04/02/14 0042 04/02/14 0443  NA 155* 159*  K 3.8 4.0  CL 119* 122*  CO2 24 24  GLUCOSE 314* 271*  BUN 26* 27*  CREATININE 0.69 0.73  CALCIUM 7.9* 8.3*   Liver Function Tests:  Recent Labs Lab 03/26/14 2049  AST 34  ALT 19  ALKPHOS 83  BILITOT 0.7  PROT 7.5  ALBUMIN 3.5   CBC:  Recent Labs Lab 03/26/14 2049  03/31/14 0556 03/31/14 1809  WBC 20.5*  < > 11.0* 11.4*  NEUTROABS 17.3*  --   --  8.1*  HGB 17.0*  < > 12.7 13.0  HCT  49.5*  < > 39.5 39.1  MCV 88.1  < > 90.0 89.7  PLT 279  < > 216 211  < > = values in this interval not displayed. Coagulation: No results found for this basename: LABPROT, INR,  in the last 168 hours Cardiac Enzymes:  Recent Labs Lab 03/26/14 2049  TROPONINI <0.30   Urinalysis:  Recent Labs Lab 03/26/14 2157 03/31/14 2139  COLORURINE AMBER* YELLOW  LABSPEC 1.015 1.024  PHURINE 8.0 5.5  GLUCOSEU NEGATIVE 100*  HGBUR LARGE* TRACE*  BILIRUBINUR SMALL* NEGATIVE  KETONESUR NEGATIVE NEGATIVE  PROTEINUR 30* NEGATIVE  UROBILINOGEN 4.0* 1.0  NITRITE NEGATIVE NEGATIVE  LEUKOCYTESUR SMALL* NEGATIVE   Lipid  Panel    Component Value Date/Time   CHOL 153 03/27/2014 0626   TRIG 144 03/27/2014 0626   HDL 33* 03/27/2014 0626   CHOLHDL 4.6 03/27/2014 0626   VLDL 29 03/27/2014 0626   LDLCALC 91 03/27/2014 0626   HgbA1C  Lab Results  Component Value Date   HGBA1C 6.8* 03/27/2014    Urine Drug Screen:     Component Value Date/Time   LABOPIA NONE DETECTED 03/26/2014 2157   COCAINSCRNUR NONE DETECTED 03/26/2014 2157   LABBENZ NONE DETECTED 03/26/2014 2157   AMPHETMU NONE DETECTED 03/26/2014 2157   THCU NONE DETECTED 03/26/2014 2157   LABBARB NONE DETECTED 03/26/2014 2157    Alcohol Level: No results found for this basename: ETH,  in the last 168 hours  Ct Head Wo Contrast 03/31/2014    Evolving left ACA distribution infarct.    Mr Brain Wo Contrast 04/01/2014    Evolutionary changes of the previously seen left anterior cerebral artery territory infarction. Petechial blood products within the region of infarction without frank hematoma. Swelling with left-to-right midline shift of 2 mm. Some extension of the infarction into the splenium of the corpus callosum.  Two new punctate acute infarctions, 1 within the left posterior temporal lobe in the other within the right parietal lobe. New development of these infarctions raises the possibility of micro embolic disease.     Dg Chest Portable 1 View 03/31/2014    No significant interval change in the appearance of the chest. Persistent very low inspiratory volumes with bibasilar atelectasis.     Dg Abd Portable 1v 03/31/2014    Air-filled loops of bowel throughout abdomen greater on left, majority colonic though a few bowel loops in the left lower quadrant are indeterminate for sigmoid colon versus small bowel.  No definite bowel wall thickening.  If patient has persistent symptoms, consider CT imaging to exclude small bowel dilatation.      2D Echocardiogram  poor acoustic window. No cardiac source of emboli was identified.  Carotid Doppler  Bilateral  carotid bifurcation and proximal ICA plaque, resulting in less than 50% diameter stenosis. The exam does not exclude plaque ulceration or embolization. Continued surveillance recommended.   CXR    EKG sinus tachycardia rate 104 beats per minute. For complete results please see formal report.   Therapy Recommendations pending  Physical Exam   Mental Status:  The patient is lethargic. She follows only some commands. She is aphasic and unable to answer most questions.  Cranial Nerves:  II: Discs not visualized; Visual fields grossly normal, pupils equal, round, and sluggish.  III,IV, VI: ptosis not present - the patient was unable to follow commands for eom testing.  V,VII: right lower facial droop, facial light touch sensation normal bilaterally  VIII: hearing normal bilaterally  IX,X: gag reflex present  XI:  bilateral shoulder shrug could not be tested  XII: the patient would not extend her tongue.  Motor:  Right : Upper extremity 0/5 Left: Upper extremity 3-4/5 (generalized weakness on the left)  Lower extremity 0/5 Lower extremity 2/5  Tone and bulk:normal tone throughout; no atrophy noted  Sensory: light touch intact throughout, bilaterally  Deep Tendon Reflexes: lower extremity reflexes absent. Upper extremities 2+ bilaterally.  Plantars:  Right: upgoing Left: downgoing  Cerebellar:  Unable to test  Gait: unable to ambulate   ASSESSMENT Ms. Diane Rangel is a 80 y.o. female presenting with right hemiplegia and aphasia. TPA was not administered secondary to late presentation. An MRI revealed a left anterior cerebral artery territory infarct as well as to acute punctate infarcts. Infarcts felt secondary to emboli of unknown source. On aspirin 81 mg orally every day prior to admission. Now on Aspirin suppository for secondary stroke prevention. Patient with resultant right hemiplegia and aphasia. Stroke work up underway.   Recent severe diarrhea and dehydration  Recent urinary  tract infection  Electrolyte abnormalities  Hypertension history  Diabetes mellitus - hemoglobin A1c 6.8  Previous strokes  Cholesterol 153; LDL 91   Hospital day # 2  TREATMENT/PLAN  Continue Aspirin suppositories for secondary stroke prevention.  Await therapy evaluations  May need TEE and lipid quarter    Delton Seeavid Rinehuls PA-C [facility] Pager [phone] 04/02/2014, 8:42 AM  I have personally obtained a history, examined the patient, evaluated imaging results, and formulated the assessment and plan of care. I agree with the above.  Bilateral infarcts appear to be embolic in nature. Stroke work up. Possibility of TEE.  Long family discussion today Pauletta BrownsYuriy Chele Cornell    To contact Stroke Continuity provider, please refer to [email or web address]. After hours, contact General Neurology

## 2014-04-03 ENCOUNTER — Inpatient Hospital Stay (HOSPITAL_COMMUNITY): Payer: Medicare Other

## 2014-04-03 LAB — BASIC METABOLIC PANEL
BUN: 31 mg/dL — ABNORMAL HIGH (ref 6–23)
BUN: 30 mg/dL — AB (ref 6–23)
BUN: 30 mg/dL — AB (ref 6–23)
CALCIUM: 8.4 mg/dL (ref 8.4–10.5)
CALCIUM: 8.4 mg/dL (ref 8.4–10.5)
CHLORIDE: 120 meq/L — AB (ref 96–112)
CHLORIDE: 117 meq/L — AB (ref 96–112)
CO2: 24 meq/L (ref 19–32)
CO2: 21 meq/L (ref 19–32)
CO2: 23 meq/L (ref 19–32)
CREATININE: 0.79 mg/dL (ref 0.50–1.10)
CREATININE: 0.79 mg/dL (ref 0.50–1.10)
Calcium: 8.3 mg/dL — ABNORMAL LOW (ref 8.4–10.5)
Chloride: 121 mEq/L — ABNORMAL HIGH (ref 96–112)
Creatinine, Ser: 0.76 mg/dL (ref 0.50–1.10)
GFR calc Af Amer: >90 mL/min (ref >90)
GFR calc Af Amer: >90 mL/min (ref >90)
GFR calc Af Amer: >90 mL/min (ref >90)
GFR calc non Af Amer: 78 mL/min — ABNORMAL LOW (ref >90)
GFR calc non Af Amer: 78 mL/min — ABNORMAL LOW (ref >90)
GFR calc non Af Amer: 79 mL/min — ABNORMAL LOW (ref >90)
GLUCOSE: 248 mg/dL — AB (ref 70–99)
Glucose, Bld: 329 mg/dL — ABNORMAL HIGH (ref 70–99)
Glucose, Bld: 442 mg/dL — ABNORMAL HIGH (ref 70–99)
Potassium: 4.4 mEq/L (ref 3.7–5.3)
Potassium: 4.7 mEq/L (ref 3.7–5.3)
Potassium: 5.4 mEq/L — ABNORMAL HIGH (ref 3.7–5.3)
Sodium: 156 mEq/L — ABNORMAL HIGH (ref 137–147)
Sodium: 152 mEq/L — ABNORMAL HIGH (ref 137–147)
Sodium: 155 mEq/L — ABNORMAL HIGH (ref 137–147)

## 2014-04-03 LAB — GLUCOSE, CAPILLARY
GLUCOSE-CAPILLARY: 441 mg/dL — AB (ref 70–99)
GLUCOSE-CAPILLARY: 451 mg/dL — AB (ref 70–99)
Glucose-Capillary: 215 mg/dL — ABNORMAL HIGH (ref 70–99)
Glucose-Capillary: 285 mg/dL — ABNORMAL HIGH (ref 70–99)
Glucose-Capillary: 288 mg/dL — ABNORMAL HIGH (ref 70–99)
Glucose-Capillary: 366 mg/dL — ABNORMAL HIGH (ref 70–99)
Glucose-Capillary: 424 mg/dL — ABNORMAL HIGH (ref 70–99)

## 2014-04-03 MED ORDER — DEXAMETHASONE 2 MG PO TABS
2.0000 mg | ORAL_TABLET | Freq: Three times a day (TID) | ORAL | Status: DC
  Filled 2014-04-03 (×3): qty 1

## 2014-04-03 MED ORDER — INSULIN GLARGINE 100 UNIT/ML ~~LOC~~ SOLN
25.0000 [IU] | Freq: Every day | SUBCUTANEOUS | Status: DC
  Administered 2014-04-03: 25 [IU] via SUBCUTANEOUS
  Filled 2014-04-03 (×2): qty 0.25

## 2014-04-03 MED ORDER — DEXTROSE 5 % IV SOLN
2.0000 g | Freq: Two times a day (BID) | INTRAVENOUS | Status: DC
  Administered 2014-04-03 – 2014-04-04 (×2): 2 g via INTRAVENOUS
  Filled 2014-04-03 (×5): qty 2

## 2014-04-03 MED ORDER — INSULIN ASPART 100 UNIT/ML ~~LOC~~ SOLN: 15.0000 [IU] | Freq: Once | SUBCUTANEOUS | Status: DC

## 2014-04-03 MED ORDER — DEXAMETHASONE SODIUM PHOSPHATE 4 MG/ML IJ SOLN
2.0000 mg | Freq: Three times a day (TID) | INTRAMUSCULAR | Status: DC
  Administered 2014-04-03 – 2014-04-04 (×4): 2 mg via INTRAVENOUS
  Filled 2014-04-03: qty 1
  Filled 2014-04-03: qty 0.5
  Filled 2014-04-03: qty 1
  Filled 2014-04-03 (×2): qty 0.5

## 2014-04-03 MED ORDER — INSULIN ASPART 100 UNIT/ML ~~LOC~~ SOLN
0.0000 [IU] | Freq: Every day | SUBCUTANEOUS | Status: DC
  Administered 2014-04-03: 2 [IU] via SUBCUTANEOUS

## 2014-04-03 MED ORDER — ASPIRIN 300 MG RE SUPP
300.0000 mg | Freq: Every day | RECTAL | Status: DC
  Administered 2014-04-04 – 2014-04-06 (×3): 300 mg via RECTAL
  Filled 2014-04-03 (×3): qty 1

## 2014-04-03 MED ORDER — INSULIN GLARGINE 100 UNIT/ML ~~LOC~~ SOLN
16.0000 [IU] | Freq: Every day | SUBCUTANEOUS | Status: DC
  Filled 2014-04-03: qty 0.16

## 2014-04-03 MED ORDER — INSULIN ASPART 100 UNIT/ML ~~LOC~~ SOLN
0.0000 [IU] | Freq: Three times a day (TID) | SUBCUTANEOUS | Status: DC
  Administered 2014-04-03: 11 [IU] via SUBCUTANEOUS
  Administered 2014-04-03: 15 [IU] via SUBCUTANEOUS

## 2014-04-03 MED ORDER — SODIUM CHLORIDE 0.45 % IV SOLN
INTRAVENOUS | Status: DC
  Administered 2014-04-03: 1000 mL via INTRAVENOUS

## 2014-04-03 MED ORDER — LEVOFLOXACIN 750 MG PO TABS
750.0000 mg | ORAL_TABLET | Freq: Every day | ORAL | Status: DC
  Administered 2014-04-03: 750 mg via ORAL
  Filled 2014-04-03: qty 1

## 2014-04-03 MED ORDER — ASPIRIN EC 325 MG PO TBEC
325.0000 mg | DELAYED_RELEASE_TABLET | Freq: Every day | ORAL | Status: DC
  Administered 2014-04-03: 325 mg via ORAL
  Filled 2014-04-03: qty 1

## 2014-04-03 MED ORDER — INSULIN ASPART 100 UNIT/ML ~~LOC~~ SOLN
0.0000 [IU] | SUBCUTANEOUS | Status: DC
  Administered 2014-04-04: 15 [IU] via SUBCUTANEOUS
  Administered 2014-04-04: 11 [IU] via SUBCUTANEOUS
  Administered 2014-04-04: 4 [IU] via SUBCUTANEOUS
  Administered 2014-04-04: 3 [IU] via SUBCUTANEOUS

## 2014-04-03 MED ORDER — SODIUM CHLORIDE 0.9 % IV SOLN: INTRAVENOUS | Status: DC

## 2014-04-03 MED ORDER — VANCOMYCIN HCL 10 G IV SOLR
1250.0000 mg | Freq: Two times a day (BID) | INTRAVENOUS | Status: DC
  Administered 2014-04-03 – 2014-04-04 (×2): 1250 mg via INTRAVENOUS
  Filled 2014-04-03 (×5): qty 1250

## 2014-04-03 NOTE — Progress Notes (Signed)
Patient CBG [phone]-paged Dr. Windy Cannyunghel will await orders.  Neurology just switched fluids from D5 to .045 NS.  New fluids started.  Patient asymptomatic will continue to monitor.  Lance BoschAnna Onesti Bonfiglio, RN

## 2014-04-03 NOTE — Progress Notes (Signed)
Pt's foley catheter DC at 0030 per order as there was no valid reason for it,pt yet to void, bladder scan done at 0643, read about 37656ml,pt repositioned in bed,will continue to monitor. Peyson Delao Efe Obasogie-Asidi

## 2014-04-03 NOTE — Progress Notes (Signed)
ANTIBIOTIC CONSULT NOTE - FOLLOW UP  Pharmacy Consult for vancomycin and cefepime Indication: HCAP  No Known Allergies  Patient Measurements: Height: 5\' 9"  (175.3 cm) Weight: 263 lb 3.7 oz (119.4 kg) IBW/kg (Calculated) : 66.2  Vital Signs: Temp: 99.9 F (37.7 C) (04/27 1437) Temp src: Oral (04/27 1437) BP: 151/66 mmHg (04/27 1437) Pulse Rate: 113 (04/27 1437) Intake/Output from previous day: 04/26 0701 - 04/27 0700 In: 340 [P.O.:340] Out: 800 [Urine:700; Stool:100] Intake/Output from this shift: Total I/O In: 320 [P.O.:320] Out: -   Labs:  Recent Labs  03/31/14 1809  04/02/14 1821 04/03/14 0300 04/03/14 1020  WBC 11.4*  --   --   --   --   HGB 13.0  --   --   --   --   PLT 211  --   --   --   --   CREATININE 0.89  < > 0.78 0.79 0.79  < > = values in this interval not displayed. Estimated Creatinine Clearance: 80.1 ml/min (by C-G formula based on Cr of 0.79). No results found for this basename: VANCOTROUGH, VANCOPEAK, VANCORANDOM, GENTTROUGH, GENTPEAK, GENTRANDOM, TOBRATROUGH, TOBRAPEAK, TOBRARND, AMIKACINPEAK, AMIKACINTROU, AMIKACIN,  in the last 72 hours   Microbiology: Recent Results (from the past 720 hour(s))  URINE CULTURE     Status: None   Collection Time    03/26/14  9:57 PM      Result Value Ref Range Status   Specimen Description URINE, CATHETERIZED   Final   Special Requests NONE   Final   Culture  Setup Time     Final   Value: 03/27/2014 02:30     Performed at [facility] Count     Final   Value: >=100,000 COLONIES/ML     Performed at [facility]   Culture     Final   Value: KLEBSIELLA PNEUMONIAE     Performed at [facility]   Report Status 03/28/2014 FINAL   Final   Organism ID, Bacteria KLEBSIELLA PNEUMONIAE   Final  CLOSTRIDIUM DIFFICILE BY PCR     Status: None   Collection Time    03/26/14 10:10 PM      Result Value Ref Range Status   C difficile by pcr NEGATIVE  NEGATIVE Final  CULTURE, BLOOD  (ROUTINE X 2)     Status: None   Collection Time    03/27/14  9:57 AM      Result Value Ref Range Status   Specimen Description BLOOD RIGHT ANTECUBITAL   Final   Special Requests     Final   Value: BOTTLES DRAWN AEROBIC AND ANAEROBIC AEB=9CC ANA=11CC   Culture NO GROWTH 5 DAYS   Final   Report Status 04/01/2014 FINAL   Final  CULTURE, BLOOD (ROUTINE X 2)     Status: None   Collection Time    03/27/14 10:03 AM      Result Value Ref Range Status   Specimen Description BLOOD LEFT HAND   Final   Special Requests BOTTLES DRAWN AEROBIC AND ANAEROBIC 10CC   Final   Culture NO GROWTH 5 DAYS   Final   Report Status 04/01/2014 FINAL   Final  CULTURE, BLOOD (ROUTINE X 2)     Status: None   Collection Time    03/31/14 12:37 AM      Result Value Ref Range Status   Specimen Description BLOOD RIGHT ARM   Final   Special Requests BOTTLES DRAWN AEROBIC AND  ANAEROBIC 10CC   Final   Culture  Setup Time     Final   Value: 04/01/2014 03:38     [facility]formed [facility]   Culture     Final   Value:        BLOOD CULTURE RECEIVED NO GROWTH TO DATE CULTURE WILL BE HELD FOR 5 DAYS BEFORE ISSUING A FINAL NEGATIVE REPORT     [facility]formed at [facility]   Report Status PENDING   Incomplete  CULTURE, BLOOD (ROUTINE X 2)     Status: None   Collection Time    03/31/14 12:45 AM      Result Value Ref Range Status   Specimen Description BLOOD RIGHT HAND   Final   Special Requests BOTTLES DRAWN AEROBIC ONLY 1CC   Final   Culture  Setup Time     Final   Value: 04/01/2014 03:38     Performed at [facility]   Culture     Final   Value:        BLOOD CULTURE RECEIVED NO GROWTH TO DATE CULTURE WILL BE HELD FOR 5 DAYS BEFORE ISSUING A FINAL NEGATIVE REPORT     [facility]formed at [facility]   Report Status PENDING   Incomplete  URINE CULTURE     Status: None   Collection Time    03/31/14  9:39 PM      Result Value Ref Range Status   Specimen Description URINE, CATHETERIZED   Final    Special Requests NONE   Final   Culture  Setup Time     Final   Value: 03/31/2014 23:51     [facility]formed at [facility]   Colony Count     Final   Value: NO GROWTH     [facility]formed at [facility]   Culture     Final   Value: NO GROWTH     Performed at [facility]   Report Status 04/02/2014 FINAL   Final  CLOSTRIDIUM DIFFICILE BY PCR     Status: None   Collection Time    04/01/14  5:22 AM      Result Value Ref Range Status   C difficile by pcr NEGATIVE  NEGATIVE Final    Anti-infectives   Start     Dose/Rate Route Frequency Ordered Stop   04/03/14 2000  ceFEPIme (MAXIPIME) 2 g in dextrose 5 % 50 mL IVPB     2 g 100 mL/hr over 30 Minutes Intravenous Every 12 hours 04/03/14 1531     04/03/14 1600  vancomycin (VANCOCIN) 1,250 mg in sodium chloride 0.9 % 250 mL IVPB     1,250 mg 166.7 mL/hr over 90 Minutes Intravenous Every 12 hours 04/03/14 1531     04/03/14 1000  levofloxacin (LEVAQUIN) tablet 750 mg  Status:  Discontinued     750 mg Oral Daily 04/03/14 0735 04/03/14 1516   04/01/14 1600  vancomycin (VANCOCIN) 1,250 mg in sodium chloride 0.9 % 250 mL IVPB  Status:  Discontinued     1,250 mg 166.7 mL/hr over 90 Minutes Intravenous Every 12 hours 03/31/14 2312 04/03/14 0946   03/31/14 2315  vancomycin (VANCOCIN) 2,500 mg in sodium chloride 0.9 % 500 mL IVPB     2,500 mg 250 mL/hr over 120 Minutes Intravenous NOW 03/31/14 2312 04/01/14 0305   03/31/14 2300  ceFEPIme (MAXIPIME) 1 g in dextrose 5 % 50 mL IVPB  Status:  Discontinued     1  g 100 mL/hr over 30 Minutes Intravenous 3 times per day 03/31/14 2242 03/31/14 2245   03/31/14 2000  ceFEPIme (MAXIPIME) 2 g in dextrose 5 % 50 mL IVPB  Status:  Discontinued     2 g 100 mL/hr over 30 Minutes Intravenous Every 12 hours 03/31/14 1952 04/03/14 0946   03/31/14 1900  vancomycin (VANCOCIN) IVPB 1000 mg/200 mL premix     1,000 mg 200 mL/hr over 60 Minutes Intravenous  Once 03/31/14 1855 03/31/14 2253   03/31/14 1900   piperacillin-tazobactam (ZOSYN) IVPB 3.375 g  Status:  Discontinued     3.375 g 100 mL/hr over 30 Minutes Intravenous  Once 03/31/14 1855 03/31/14 1930      Assessment: 80 y/o female on day #4 vancomycin and cefepime for HCAP. Patient has low grade fever, WBC mildly elevated, and renal function is stable. Cultures are negative thus far.  Goal of Therapy:  Vancomycin trough level 15-20 mcg/ml  Plan:  - Continue vancomycin 1250 mg IV q12h - Continue cefepime 2 g IV q12h - Monitor renal function, clinical progress, and culture data  [facility] LLCJennifer [place], Pharm.D., BCPS Clinical Pharmacist Pager: [phone] 04/03/2014 3:46 PM

## 2014-04-03 NOTE — Consult Note (Signed)
Referring Provider: No ref. provider found Primary Care Physician:  Ernestine ConradBLUTH, KIRK, MD Primary Nephrologist:  Dr. Brent Generalnone  Reason for Consultation:  Hypernatremia  HPI: 80 y/o female with recent left ACA stroke with residual right hemiplegia and mental status changes. She was discharged from [facility] to [facility] only yesterday. Prior to discharge she had diarrhea and hypernatremia and required rectal tube placement. Barely after arriving to SNF she developed fever and increasing lethargy and [facility] Head CT showed evolution of left ACA stoke. Her hypernatremia worsened to 157   Past Medical History  Diagnosis Date  . Hypertension   . Diabetes mellitus without complication   . CVA (cerebral infarction)   . UTI (lower urinary tract infection)     klebsiella  . Stroke     Past Surgical History  Procedure Laterality Date  . Ankle surgery      Prior to Admission medications   Medication Sig Start Date End Date Taking? Authorizing Provider  amLODipine (NORVASC) 10 MG tablet Take 10 mg by mouth daily. 03/03/14  Yes Historical Provider, MD  aspirin EC 81 MG tablet Take 1 tablet (81 mg total) by mouth daily. 03/31/14  Yes Estela Isaiah BlakesY Hernandez Acosta, MD  l-methylfolate-B6-B12 (METANX) 3-35-2 MG TABS Take 1 tablet by mouth daily.   Yes Historical Provider, MD  NOVOLIN N RELION 100 UNIT/ML injection Inject 30 Units into the skin 2 (two) times daily before a meal.  03/08/14  Yes Historical Provider, MD  polyethylene glycol powder (GLYCOLAX/MIRALAX) powder Take 17 g by mouth daily. 02/24/14  Yes Historical Provider, MD  ranitidine (ZANTAC) 150 MG tablet Take 150 mg by mouth 2 (two) times daily. 01/21/14  Yes Historical Provider, MD  levofloxacin (LEVAQUIN) 750 MG tablet Take 1 tablet (750 mg total) by mouth daily. For 5 days 03/31/14   Henderson CloudEstela Y Hernandez Acosta, MD    Current Facility-Administered Medications  Medication Dose Route Frequency Provider Last Rate Last Dose  . 0.45 % sodium chloride infusion    Intravenous Continuous Layne BentonSharon L Biby, NP 50 mL/hr at 04/03/14 1140 1,000 mL at 04/03/14 1140  . [START ON 04/04/2014] aspirin suppository 300 mg  300 mg Rectal Daily Layne BentonSharon L Biby, NP      . dexamethasone (DECADRON) injection 2 mg  2 mg Intravenous TID Nishant Dhungel, MD      . food thickener (THICK IT) powder   Oral PRN Nishant Dhungel, MD      . free water 350 mL  350 mL Oral Q6H Nishant Dhungel, MD   350 mL at 04/03/14 1000  . heparin injection 5,000 Units  5,000 Units Subcutaneous 3 times per day Lynden OxfordPranav Patel, MD   5,000 Units at 04/03/14 [phone]  . hydrALAZINE (APRESOLINE) injection 10 mg  10 mg Intravenous Q4H PRN Lynden OxfordPranav Patel, MD   10 mg at 04/01/14 0402  . insulin aspart (novoLOG) injection 0-20 Units  0-20 Units Subcutaneous TID WC Nishant Dhungel, MD   15 Units at 04/03/14 1157  . insulin aspart (novoLOG) injection 0-5 Units  0-5 Units Subcutaneous QHS Nishant Dhungel, MD      . insulin aspart (novoLOG) injection 15 Units  15 Units Subcutaneous Once Nishant Dhungel, MD      . insulin glargine (LANTUS) injection 16 Units  16 Units Subcutaneous QHS Nishant Dhungel, MD      . ipratropium-albuterol (DUONEB) 0.5-2.5 (3) MG/3ML nebulizer solution 3 mL  3 mL Nebulization TID Nishant Dhungel, MD   3 mL at 04/03/14 0954  .  levofloxacin (LEVAQUIN) tablet 750 mg  750 mg Oral Daily Nishant Dhungel, MD   750 mg at 04/03/14 1004  . pantoprazole (PROTONIX) EC tablet 40 mg  40 mg Oral Daily Nishant Dhungel, MD   40 mg at 04/03/14 1004    Allergies as of 03/31/2014  . (No Known Allergies)    History reviewed. No pertinent family history.  History   Social History  . Marital Status: Married    Spouse Name: Diane Rangel    Number of Children: Diane Rangel  . Years of Education: Diane Rangel   Occupational History  . Not on file.   Social History Main Topics  . Smoking status: Never Smoker   . Smokeless tobacco: Not on file  . Alcohol Use: No  . Drug Use: No  . Sexual Activity: Not on file   Other Topics Concern  .  Not on file   Social History Narrative  . No narrative on file    Review of Systems: Evolution of recent left ACA infarct and 2 new infarct on MRI brain   Physical Exam: Vital signs in last 24 hours: Temp:  [97.9 F (36.6 C)-99.4 F (37.4 C)] 97.9 F (36.6 C) (04/27 1047) Pulse Rate:  [95-109] 109 (04/27 1047) Resp:  [20-30] 22 (04/27 1047) BP: (144-161)/(53-79) 148/53 mmHg (04/27 1047) SpO2:  [95 %-99 %] 96 % (04/27 1047) Weight:  [119.4 kg (263 lb 3.7 oz)] 119.4 kg (263 lb 3.7 oz) (04/27 0518) Last BM Date: 04/02/14 General:  Chronically ill appearing Head:  Normocephalic and atraumatic. Eyes:  Sclera clear, no icterus.   Conjunctiva pink. Ears:  Normal auditory acuity. Nose:  No deformity, discharge,  or lesions. Mouth:  No deformity or lesions, dentition normal. Neck:  Supple; no masses or thyromegaly. JVP not elevated Lungs:  Clear throughout to auscultation.   No wheezes, crackles, or rhonchi. No acute distress. Heart:  Regular rate and rhythm; no murmurs, clicks, rubs,  or gallops. Abdomen:  Soft, nontender and nondistended. No masses, hepatosplenomegaly or hernias noted. Normal bowel sounds, without guarding, and without rebound.   Msk:  Symmetrical without gross deformities.  Pulses:  No carotid, renal, femoral bruits. DP and PT symmetrical and equal Extremities:  Without clubbing or edema. Neurologic:  Facial droop   Flat affect Skin:  Intact without significant lesions or rashes. Cervical Nodes:  No significant cervical adenopathy. Psych: Flat  Intake/Output from previous day: 04/26 0701 - 04/27 0700 In: 340 [P.O.:340] Out: 800 [Urine:700; Stool:100] Intake/Output this shift: Total I/O In: 320 [P.O.:320] Out: -   Lab Results:  Recent Labs  03/31/14 1809  WBC 11.4*  HGB 13.0  HCT 39.1  PLT 211   BMET  Recent Labs  04/02/14 1821 04/03/14 0300 04/03/14 1020  NA 154* 156* 152*  K 3.9 4.4 4.7  CL 119* 120* 117*  CO2 23 24 21   GLUCOSE 386*  329* 442*  BUN 29* 31* 30*  CREATININE 0.78 0.79 0.79  CALCIUM 8.3* 8.4 8.3*   LFT No results found for this basename: PROT, ALBUMIN, AST, ALT, ALKPHOS, BILITOT, BILIDIR, IBILI,  in the last 72 hours PT/INR No results found for this basename: LABPROT, INR,  in the last 72 hours Hepatitis Panel No results found for this basename: HEPBSAG, HCVAB, HEPAIGM, HEPBIGM,  in the last 72 hours  Studies/Results: No results found.  Assessment/Plan:  Hypernatremia  With clearly a free water  Deficit   Decreased water intake from stroke and increased loss from diarrhea  Na slowly improving  Continue current therapy   LOS: 3 Diane Rangel @TODAY @1 :15 PM

## 2014-04-03 NOTE — Progress Notes (Signed)
Speech Language Pathology Treatment: Dysphagia  Patient Details Name: Dellie Catholicnez P Thelen MRN: [id number] DOB: 06/17/1934 Today's Date: 04/03/2014 Time: [id number] SLP Time Calculation (min): 30 min  Assessment / Plan / Recommendation Clinical Impression  Pt today appears lethargic and dyspneic at baseline - per RN, pt recently had a breathing treatment.  Oral care provided *pt's tongue was coated with thick secretions - obvious xerostomia.  SLP provided pt with 3 tsp amounts of honey thick liquid.  Delayed swallow initiation noted followed by reflexive throat clearing that may indicate infiltration into larynx.  Pt cued to cough strongly but cough was weak and non-productive.    Due to pt's gross weakness, lethargy and possible aspiration, SLP recommends to make pt NPO and follow for po readiness.  Concern present for swallow function to improve to adequate level given ongoing neuro events, fever/lethargy and pt's gross weakness.   Taught granddaughter how to use oral suction if indicated and reviewed importance of oral care.    Granddaughter Candace present and she and pt were educated to findings and recommendations.  Pt is severely aphasic as well and answered yes/no questions appropriately 50% of opportunities with max cues to nod her head.  Pt verbalized "ok"and "yes" - repeating SLP only.  Pt would benefit from SLE if MD indicates to help maximize basic functional communication.   Advised Candace to help pt to work on correct yes/no responses first for efficiency.   SLP to follow for po readiness, MBS indication and to help facilitate pt communication.     HPI HPI: Pt is a 80 year-old female with pmhx of HTN, diabetes mellitus, recent left ACA stroke and dysphagia.  She was brought in from a nursing home ([facility] per family) where she was recently admitted to from the hospital.  She was started on an oral diet with dysphagia monitoring at the nursing home.  Family reports one incident of pt receiving  thin liquids via cup d/t lack of communication that pt was on nectar-thickened liquids.  Pt was at her baseline with dependence with ADL's.  She recently had a MBS indicating need for nectar-thickened liquids and a Dysphagia 1 diet.  BSE completed over the weekend recommended puree/honey thick liquid diet.  Today pt seen to assess po tolerance and readiness for dietary advancement or instrumental evaluation.  Granddaughter Candace present and reports pt with decreased function over the weekend.  MRI 4/25 showed evolving changes, left ACA .  MBS was completed 4/23.  CT abdomen showed retained stool in right colon, gaseous distention of colon.     Pertinent Vitals Afebrile, decreased- appears mildly dyspneic at rest  SLP Plan  Continue with current plan of care    Recommendations Diet recommendations: NPO (with adequate oral care ) Medication Administration: Via alternative means              Oral Care Recommendations: Oral care Q4 per protocol Follow up Recommendations: Skilled Nursing facility Plan: Continue with current plan of care    GO     Donavan Burnetamara Alicya Bena, MS [facility] SLP [phone]

## 2014-04-03 NOTE — Progress Notes (Signed)
Inpatient Diabetes Program Recommendations  AACE/ADA: New Consensus Statement on Inpatient Glycemic Control (2013)  Target Ranges:  Prepandial:   less than 140 mg/dL      Peak postprandial:   less than 180 mg/dL (1-2 hours)      Critically ill patients:  140 - 180 mg/dL  Results for Dellie CatholicJOINES, Niharika P (MRN [id number]) as of 04/03/2014 13:41  Ref. Range 04/03/2014 00:03 04/03/2014 06:45 04/03/2014 11:39 04/03/2014 11:40 04/03/2014 12:29  Glucose-Capillary Latest Range: 70-99 mg/dL 147366 (H) 829285 (H) 562441 (H) 451 (H) 424 (H)   Inpatient Diabetes Program Recommendations Insulin - Basal: increase Lantus to 25 units  Correction (SSI): . Insulin - Meal Coverage: consider adding Novolog 5 units TID if NPO status is changed to a diet This coordinator spoke with Dr. Gonzella Lexhungel and made above recommendation.  Will follow. Thank you  Piedad ClimesGina Moxie Kalil BSN, RN,CDE Inpatient Diabetes Coordinator [phone] (team pager)

## 2014-04-03 NOTE — Evaluation (Signed)
Physical Therapy Evaluation Patient Details Name: Diane Rangel MRN: [id number] DOB: September 03, 1934 Today's Date: 04/03/2014   History of Present Illness  [facility] after suffering a moderate+ L ACA embolic infarct.  Now readmitted with fever and per MRI an extension of recent stroke plus two new embolic strokes  Clinical Impression  Pt admitted with/for fever and extension of recent stroke.  Pt currently limited functionally due to the problems listed. ( See problems list.)   Pt will benefit from PT to maximize function and safety in order to get ready for next venue listed below.     Follow Up Recommendations SNF    Equipment Recommendations  None recommended by PT    Recommendations for Other Services OT consult     Precautions / Restrictions Precautions Precautions: Fall      Mobility  Bed Mobility Overal bed mobility: Needs Assistance;+2 for physical assistance Bed Mobility: Rolling;Sidelying to Sit;Sit to Supine Rolling: Total assist;+2 for physical assistance Sidelying to sit: Total assist;+2 for physical assistance   Sit to supine: +2 for physical assistance;Total assist   General bed mobility comments: cues to get pt to initiate assist, but needed extensive truncal assist  Transfers Overall transfer level: Needs assistance   Transfers: Sit to/from Stand Sit to Stand: Total assist;+2 physical assistance         General transfer comment: attempted stand from EOB for clean up /pericare without success  Ambulation/Gait                Stairs            Wheelchair Mobility    Modified Rankin (Stroke Patients Only) Modified Rankin (Stroke Patients Only) Modified Rankin: Severe disability     Balance Overall balance assessment: Needs assistance Sitting-balance support: Feet supported;Single extremity supported Sitting balance-Leahy Scale: Zero Sitting balance - Comments: sat EOB>15 minutes trying to elicit truncal activation  and working on sitting balance.  Utilized side sitting on each elbow, reach forward and down toward feet, reaction to being let fall back or to the side.                                     Pertinent Vitals/Pain     Home Living Family/patient expects to be discharged to:: Skilled nursing facility Living Arrangements: Other (Comment) (grand daughter)                    Prior Function Level of Independence: Needs assistance (prior to original stroke Mod I, post strolke needs assist)               Hand Dominance        Extremity/Trunk Assessment   Upper Extremity Assessment: Defer to OT evaluation (4/27  flaccid R UE) RUE Deficits / Details: 4/27 flaccid R UE         Lower Extremity Assessment: RLE deficits/detail;LLE deficits/detail RLE Deficits / Details: R LE with no spontaneous or voluntary movement LLE Deficits / Details: Moves voluntarily, but unable to resist; grossly 2+ /5     Communication   Communication: Expressive difficulties  Cognition Arousal/Alertness: Awake/alert Behavior During Therapy: WFL for tasks assessed/performed Overall Cognitive Status: Difficult to assess                      General Comments General comments (skin integrity, edema, etc.): Rolled multiple times each direction for  pericare and repositioning with total A of 2 persons    Exercises        Assessment/Plan    PT Assessment Patient needs continued PT services  PT Diagnosis Hemiplegia dominant side   PT Problem List Decreased strength;Decreased activity tolerance;Decreased balance;Decreased mobility;Decreased coordination;Decreased knowledge of use of DME;Decreased safety awareness;Decreased knowledge of precautions;Obesity;Decreased skin integrity  PT Treatment Interventions DME instruction;Functional mobility training;Therapeutic activities;Therapeutic exercise;Balance training;Neuromuscular re-education;Patient/family education   PT Goals  (Current goals can be found in the Care Plan section) Acute Rehab PT Goals Patient Stated Goal: pt unable PT Goal Formulation: Patient unable to participate in goal setting Time For Goal Achievement: 04/17/14 Potential to Achieve Goals: Fair    Frequency Min 3X/week   Barriers to discharge        Co-evaluation               End of Session   Activity Tolerance: Patient limited by fatigue;Other (comment) (profoundly weak on eval) Patient left: in bed;with call bell/phone within reach;with bed alarm set Nurse Communication: Mobility status;Need for lift equipment         Time: [phone] PT Time Calculation (min): 37 min   Charges:   PT Evaluation $Initial PT Evaluation Tier I: 1 Procedure PT Treatments $Therapeutic Activity: 8-22 mins $Neuromuscular Re-education: 8-22 mins   PT G CodesEliseo Gum:          Kiron Osmun V Keylie Beavers 04/03/2014, 5:02 PM 04/03/2014  [place] BingKen Arch Methot, PT [phone]  (pager)

## 2014-04-03 NOTE — Progress Notes (Signed)
Stroke Team Progress Note  HISTORY Diane Rangel is a 80 y.o. female recently admitted 03/26/2014 to 03/31/2014 with severe diarrhea, syncope, and dehydration. A CT of the head on 4/19 showed no acute changes. An MRI/MRA was performed on 03/27/2014 secondary to mental status changes.  The MRI revealed an acute moderate to large sized medial left frontal lobe non-hemorrhagic infarct extending into the parietal lobe in the left anterior cerebral artery distribution. She was also noted to have remote small basal ganglia and thalamic infarcts. The MRA showed an abrupt cut off of flow in the proximal A2 segment of the left anterior cerebral artery consistent with the patient's infarct. An occluded right vertebral artery was also identified.  Dr Gerilyn Pilgrimoonquah saw the patient in consultation on 03/28/2014 and noted right hemiparesis as well as encephalopathy. The patient had been placed on aspirin 81 mg daily for secondary stroke prophylaxis. Her encephalopathy was felt to be multifactorial in the setting of a UTI, sepsis, dehydration, and stroke. She was eventually discharged to a skilled nursing facility on 03/31/2014 only [facility] with fever and increasing lethargy. An MRI was repeated on 04/01/2014 which showed evolutionary changes of her left anterior cerebral artery territory infarct with a left to right midline shift to 2 mm. There were also 2 new punctate acute infarcts. Neurology was asked to consult.   Date last known well: Date: 03/26/2014  Time last known well: Unable to determine  tPA Given: No: late presentation.   SUBJECTIVE Her granddaughter/primary caregiver is at bedside. ST also at bedside.  OBJECTIVE Most recent Vital Signs: Filed Vitals:   04/03/14 0213 04/03/14 0518 04/03/14 0956 04/03/14 1047  BP: 149/69 153/62  148/53  Pulse: 95 97  109  Temp: 99.4 F (37.4 C) 99.1 F (37.3 C)  97.9 F (36.6 C)  TempSrc: Oral Oral  Oral  Resp: 22 20  22   Height:       Weight:  119.4 kg (263 lb 3.7 oz)    SpO2: 96% 99% 96% 96%   CBG (last 3)   Recent Labs  04/02/14 1540 04/03/14 0003 04/03/14 0645  GLUCAP 341* 366* 285*    IV Fluid Intake:   . dextrose 50 mL/hr at 04/01/14 0401    MEDICATIONS  . aspirin EC  325 mg Oral Daily  . dexamethasone  2 mg Oral 3 times per day  . free water  350 mL Oral Q6H  . heparin  5,000 Units Subcutaneous 3 times per day  . insulin aspart  0-20 Units Subcutaneous TID WC  . insulin aspart  0-5 Units Subcutaneous QHS  . insulin glargine  16 Units Subcutaneous QHS  . ipratropium-albuterol  3 mL Nebulization TID  . levofloxacin  750 mg Oral Daily  . pantoprazole  40 mg Oral Daily   PRN:  food thickener, hydrALAZINE  Diet:  Dysphagia 1 honey thick liquids Activity:  Bathroom privileges with assistance DVT Prophylaxis:  Subcutaneous heparin  CLINICALLY SIGNIFICANT STUDIES Basic Metabolic Panel:   Recent Labs Lab 04/02/14 1821 04/03/14 0300  NA 154* 156*  K 3.9 4.4  CL 119* 120*  CO2 23 24  GLUCOSE 386* 329*  BUN 29* 31*  CREATININE 0.78 0.79  CALCIUM 8.3* 8.4   Liver Function Tests: No results found for this basename: AST, ALT, ALKPHOS, BILITOT, PROT, ALBUMIN,  in the last 168 hours CBC:   Recent Labs Lab 03/31/14 0556 03/31/14 1809  WBC 11.0* 11.4*  NEUTROABS  --  8.1*  HGB 12.7 13.0  HCT 39.5 39.1  MCV 90.0 89.7  PLT 216 211   Coagulation: No results found for this basename: LABPROT, INR,  in the last 168 hours Cardiac Enzymes: No results found for this basename: CKTOTAL, CKMB, CKMBINDEX, TROPONINI,  in the last 168 hours Urinalysis:   Recent Labs Lab 03/31/14 2139  COLORURINE YELLOW  LABSPEC 1.024  PHURINE 5.5  GLUCOSEU 100*  HGBUR TRACE*  BILIRUBINUR NEGATIVE  KETONESUR NEGATIVE  PROTEINUR NEGATIVE  UROBILINOGEN 1.0  NITRITE NEGATIVE  LEUKOCYTESUR NEGATIVE   Lipid Panel    Component Value Date/Time   CHOL 153 03/27/2014 0626   TRIG 144 03/27/2014 0626   HDL 33*  03/27/2014 0626   CHOLHDL 4.6 03/27/2014 0626   VLDL 29 03/27/2014 0626   LDLCALC 91 03/27/2014 0626   HgbA1C  Lab Results  Component Value Date   HGBA1C 6.8* 03/27/2014    Urine Drug Screen:     Component Value Date/Time   LABOPIA NONE DETECTED 03/26/2014 2157   COCAINSCRNUR NONE DETECTED 03/26/2014 2157   LABBENZ NONE DETECTED 03/26/2014 2157   AMPHETMU NONE DETECTED 03/26/2014 2157   THCU NONE DETECTED 03/26/2014 2157   LABBARB NONE DETECTED 03/26/2014 2157    Alcohol Level: No results found for this basename: ETH,  in the last 168 hours  Ct Head Wo Contrast 03/31/2014    Evolving left ACA distribution infarct.    Mr Brain Wo Contrast 04/01/2014    Evolutionary changes of the previously seen left anterior cerebral artery territory infarction. Petechial blood products within the region of infarction without frank hematoma. Swelling with left-to-right midline shift of 2 mm. Some extension of the infarction into the splenium of the corpus callosum.  Two new punctate acute infarctions, 1 within the left posterior temporal lobe in the other within the right parietal lobe. New development of these infarctions raises the possibility of micro embolic disease.     Dg Chest Portable 1 View 03/31/2014    No significant interval change in the appearance of the chest. Persistent very low inspiratory volumes with bibasilar atelectasis.     Dg Abd Portable 1v 03/31/2014    Air-filled loops of bowel throughout abdomen greater on left, majority colonic though a few bowel loops in the left lower quadrant are indeterminate for sigmoid colon versus small bowel.  No definite bowel wall thickening.  If patient has persistent symptoms, consider CT imaging to exclude small bowel dilatation.     2D Echocardiogram  poor acoustic window.   Carotid Doppler  Bilateral carotid bifurcation and proximal ICA plaque, resulting in less than 50% diameter stenosis. The exam does not exclude plaque ulceration or embolization.    CXR  03/31/2014 No significant interval change in the appearance of the chest. Persistent very low inspiratory volumes with bibasilar atelectasis.  EKG sinus tachycardia rate 104 beats per minute. For complete results please see formal report.   Therapy Recommendations   Physical Exam   Mental Status:  The patient is lethargic. She follows only some simple commands. She is aphasic and unable to answer most questions. Speaks only a few words. Cranial Nerves:  II: Discs not visualized; Visual fields grossly normal, pupils equal, round, and sluggish.  III,IV, VI: ptosis not present - the patient was unable to follow commands for eom testing.  V,VII: right lower facial droop, facial light touch sensation normal bilaterally  VIII: hearing normal bilaterally  IX,X: gag reflex present  XI: bilateral shoulder shrug could not be tested  XII: the patient would not extend her tongue.  Motor:  Right : Upper extremity 0/5 Left: Upper extremity 3-4/5 (generalized weakness on the left)  Lower extremity 0/5 Lower extremity 2/5  Tone and bulk:normal tone throughout; no atrophy noted  Sensory: light touch intact throughout, bilaterally  Deep Tendon Reflexes: lower extremity reflexes absent. Upper extremities 2+ bilaterally.  Plantars:  Right: upgoing Left: downgoing  Cerebellar:  Unable to test  Gait: unable to ambulate   ASSESSMENT Ms. Diane Rangel is a 80 y.o. female presenting with right hemiplegia and aphasia. An MRI done at time of initial admission revealed a left anterior cerebral artery territory infarct due to A1 occlusion (large vessel) repeat MRI revealed 2 new punctate infarct - 1 L posterior temporal lobe and 1 right parietal lobe. Cytotoxic cerebral edema. Newest iInfarcts felt secondary to emboli of unknown source. On aspirin 81 mg orally every day prior to admission. Now on aspirin 325 mg orally every day for secondary stroke prevention. Patient with resultant right hemiplegia,  aphasia, dysphagia. Stroke work up underway.   Dysphagia worsening with mild respiratory distress, possible aspiration with pnemonia, WBC 11.4 hypertension Diabetes, HgbA1c 6.8, goal < 7.0  Recent severe diarrhea and dehydration  Recent urinary tract infection  Electrolyte abnormalities - hypernatremia, hypochloridemia, hyperglycemia  Previous strokes  Cholesterol 153; LDL 91  Obesity, Body mass index is 38.85 kg/(m^2).   Hospital day # 3  TREATMENT/PLAN  Change aspirin po to Aspirin suppositories for secondary stroke prevention given worsening dysphagia  Check CXR, on levaquin PO, recommend change to zosyn  NPO  Await therapy evaluations  Change IVF to half saline given risk for cerebral edema in acute stroke from dextrose as well as hyypernatremia 150s  Current respiratory status will not allow for TEE  Granddaughter at bedside. She shares that family is considering comfort care. Her dad is coming shortly and she will discuss further with him. Long discussion with granddaughter with regards to her prognosis and answered questions  Annie MainSHARON BIBY, MSN, RN, ANVP-BC, ANP-BC, Lawernce IonGNP-BC [facility] Pager: [phone] 04/03/2014 10:52 AM  I have personally obtained a history, examined the patient, evaluated imaging results, and formulated the assessment and plan of care. I agree with the above.  Delia HeadyPramod Sethi, MD  To contact Stroke Continuity provider, please refer to [email or web address]. After hours, contact General Neurology

## 2014-04-03 NOTE — Progress Notes (Addendum)
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 She was [facility] from [facility] to [facility] only yesterday. Prior to discharge she had diarrhea and hypernatremia and required rectal tube placement. Barely after arriving to SNF she developed fever and increasing lethargy and was sent to  [facility] Head CT showed evolution of left ACA stoke. Her hypernatremia worsened to 157. MRI brain repeated this am showed evolution of left ACA infarct with 2mm left to right midline  shift and 2 new punctate infarcts over bilateral temporal lobes.   Assessment/Plan: Acute recurrent CVA Evolution of recent left ACA infarct and 2 new infarct on MRI brain ( left temporal and rt parietal ) suggestive of embolic stroke.. Has 2 mm midline shift as well and  Encephalopathy worsened. Seen by swallow eval and recommended dys level 1 diet. Continue ASA suppository. Continue with neuro checks. Recently had carotids and 2D echo so need not repeat. Will defer to  neurology on getting TEE given b/l infarct. -started on Decadron for a midline shift . We'll taper gradually. Allow permissive HTN. PT/OT eval Monitor on tele   Hypernatremia  Patient has significant hypernatremia. Possibly from dehydration.  Was admitted to [facility] with normal sodium levels. possobly from dehydration. Elevated serum osm. Urine Na normal. Follow urine osm. Placed on D5W and getting free without much improvement.  -Renal consulted for further assistance in management.  Fever and shortness of breath Patient afebrile here. resp status stable. Reportedly c/o abdomen pain in ED . abd x ray was unremarkable. Possibly has some bronchitis or early pneumonia. Placed on empiric vanco and cefepime. Ulcers negative. Will narrow antibiotics to Levaquin. -has wheezing today. continue nebs   Diabetes mellitus  Patient on the thigh water for hypernatremia. He can also on Decadron. Noted for elevated blood glucose. We'll increase Lantus to 16 units at bedtime and continue sliding scale insulin.  UTI  cx growing klebsiella. On levaquin . Continue for next 2 days and d/c   DVT Prophylaxis: subcutaneous heparin       Code Status: DNR Family Communication: None at bedside Disposition Plan: SNF   Consultants:  neurology  Procedures:  MRI brian  Antibiotics:  none  HPI/Subjective: Patient remains aphasic. Doesn't respond to commands. Required Foley placed back in for urinary retention  Objective: Filed Vitals:   04/03/14 1047  BP: 148/53  Pulse: 109  Temp: 97.9 F (36.6 C)  Resp: 22    Intake/Output Summary (Last 24 hours) at 04/03/14 1056 Last data filed at 04/03/14 0758  Gross per 24 hour  Intake    420 ml  Output    800 ml  Net   -380 ml   Filed Weights   04/01/14 0000 04/02/14 0500 04/03/14 0518  Weight: 118.7 kg (261 lb 11 oz) 118.7 kg (261 lb 11 oz) 119.4 kg (263 lb 3.7 oz)    Exam:   General:  Elderly female sitting up in bed , has flattened affect  HEENT: no pallor, moist oral mucosa, right  Facial droop  Chest: clear b/l, no added sounds  CVS: NS1&S2, no MRG  Abd: soft, NT, ND, BS+, foley in place Ext: warm, no edema CNS: AAOX0, right facial droop, 3/5 power over LUE,    Data Reviewed: Basic Metabolic Panel:  Recent Labs Lab 04/02/14 0042 04/02/14 0443 04/02/14 0811 04/02/14 1821 04/03/14 0300  NA 155* 159* 155* 154* 156*  K 3.8 4.0 3.9 3.9 4.4  CL 119* 122* 120* 119* 120*  CO2 24 24 25 23 24   GLUCOSE 314* 271* 241* 386* 329*  BUN 26* 27* 27* 29* 31*  CREATININE 0.69 0.73 0.81 0.78 0.79  CALCIUM 7.9* 8.3* 8.2* 8.3* 8.4   Liver Function Tests: No results found for this basename: AST, ALT, ALKPHOS, BILITOT, PROT, ALBUMIN,  in the last 168 hours No results found for this basename: LIPASE, AMYLASE,  in the last 168  hours No results found for this basename: AMMONIA,  in the last 168 hours CBC:  Recent Labs Lab 03/28/14 0547 03/29/14 0542 03/30/14 0618 03/31/14 0556 03/31/14 1809  WBC 21.1* 14.4* 16.0* 11.0* 11.4*  NEUTROABS  --   --   --   --  8.1*  HGB 14.0 12.5 12.2 12.7 13.0  HCT 41.4 37.9 36.7 39.5 39.1  MCV 88.1 89.2 89.1 90.0 89.7  PLT 216 207 213 216 211   Cardiac Enzymes: No results found for this basename: CKTOTAL, CKMB, CKMBINDEX, TROPONINI,  in the last 168 hours BNP (last 3 results)  Recent Labs  03/26/14 2049  PROBNP 231.2   CBG:  Recent Labs Lab 04/02/14 0009 04/02/14 0527 04/02/14 1540 04/03/14 0003 04/03/14 0645  GLUCAP 298* 234* 341* 366* 285*    Recent Results (from the past 240 hour(s))  URINE CULTURE     Status: None   Collection Time    03/26/14  9:57 PM      Result Value Ref Range Status   Specimen Description URINE, CATHETERIZED   Final   Special Requests NONE   Final   Culture  Setup Time     Final   Value: 03/27/2014 02:30     Performed at [facility] Count     Final   Value: >=100,000 COLONIES/ML     Performed at [facility]   Culture     Final   Value: KLEBSIELLA PNEUMONIAE     Performed at [facility]   Report Status 03/28/2014 FINAL   Final   Organism ID, Bacteria KLEBSIELLA PNEUMONIAE   Final  CLOSTRIDIUM DIFFICILE BY PCR     Status: None   Collection Time    03/26/14 10:10 PM      Result Value Ref Range Status   C difficile by pcr NEGATIVE  NEGATIVE Final  CULTURE, BLOOD (ROUTINE X 2)     Status: None   Collection Time    03/27/14  9:57 AM      Result Value Ref Range Status   Specimen Description BLOOD RIGHT ANTECUBITAL   Final   Special Requests     Final   Value: BOTTLES DRAWN AEROBIC AND ANAEROBIC AEB=9CC ANA=11CC   Culture NO GROWTH 5 DAYS   Final   Report Status 04/01/2014 FINAL   Final  CULTURE, BLOOD (ROUTINE X 2)     Status: None   Collection Time    03/27/14 10:03 AM      Result  Value Ref Range Status   Specimen Description BLOOD LEFT HAND   Final   Special Requests BOTTLES DRAWN AEROBIC AND ANAEROBIC 10CC   Final   Culture NO GROWTH 5 DAYS   Final   Report Status 04/01/2014 FINAL   Final  CULTURE, BLOOD (ROUTINE X 2)     Status: None   Collection Time    03/31/14 12:37 AM      Result Value  Ref Range Status   Specimen Description BLOOD RIGHT ARM   Final   Special Requests BOTTLES DRAWN AEROBIC AND ANAEROBIC 10CC   Final   Culture  Setup Time     Final   Value: 04/01/2014 03:38     [facility]formed at [facility]   Culture     Final   Value:        BLOOD CULTURE RECEIVED NO GROWTH TO DATE CULTURE WILL BE HELD FOR 5 DAYS BEFORE ISSUING A FINAL NEGATIVE REPORT     Performed at [facility]   Report Status PENDING   Incomplete  CULTURE, BLOOD (ROUTINE X 2)     Status: None   Collection Time    03/31/14 12:45 AM      Result Value Ref Range Status   Specimen Description BLOOD RIGHT HAND   Final   Special Requests BOTTLES DRAWN AEROBIC ONLY 1CC   Final   Culture  Setup Time     Final   Value: 04/01/2014 03:38     [facility]formed at [facility]   Culture     Final   Value:        BLOOD CULTURE RECEIVED NO GROWTH TO DATE CULTURE WILL BE HELD FOR 5 DAYS BEFORE ISSUING A FINAL NEGATIVE REPORT     [facility]formed at [facility]   Report Status PENDING   Incomplete  URINE CULTURE     Status: None   Collection Time    03/31/14  9:39 PM      Result Value Ref Range Status   Specimen Description URINE, CATHETERIZED   Final   Special Requests NONE   Final   Culture  Setup Time     Final   Value: 03/31/2014 23:51     [facility]formed at [facility]   Colony Count     Final   Value: NO GROWTH     [facility]formed at [facility]   Culture     Final   Value: NO GROWTH     Performed at [facility]   Report Status 04/02/2014 FINAL   Final  CLOSTRIDIUM DIFFICILE BY PCR     Status: None   Collection Time    04/01/14  5:22 AM      Result  Value Ref Range Status   C difficile by pcr NEGATIVE  NEGATIVE Final     Studies: Mr Brain Wo Contrast  04/01/2014   CLINICAL DATA:  Fever.  Stroke.  EXAM: MRI HEAD WITHOUT CONTRAST  TECHNIQUE: Multiplanar, multiecho pulse sequences of the brain and surrounding structures were obtained without intravenous contrast.  COMPARISON:  Head CT 03/31/2014.  MRI 03/27/2014.  FINDINGS: There continues true restricted diffusion throughout the left anterior cerebral artery territory. The area of involvement shows mild swelling. There is mild mass effect with left-to-right shift of 2 mm. Petechial blood products are present within the region of infarction, without focal hematoma formation. I think there is been slight extension of the infarction into the splenium of the corpus callosum. Involvement of the body of the corpus callosum is unchanged. There is in a punctate acute infarction seen within the left temporal lobe posteriorly. There is a punctate acute infarction now evident in the right parietal lobe. These suggest new micro embolic infarctions. No hydrocephalus. No extra-axial collection. No other new finding.  IMPRESSION: Evolutionary changes of the previously seen left anterior cerebral artery territory infarction. Petechial blood products within the region of infarction without frank hematoma.  Swelling with left-to-right midline shift of 2 mm. Some extension of the infarction into the splenium of the corpus callosum.  Two new punctate acute infarctions, 1 within the left posterior temporal lobe in the other within the right parietal lobe. New development of these infarctions raises the possibility of micro embolic disease.   Electronically Signed   By: Paulina FusiMark  Shogry M.D.   On: 04/01/2014 11:15    Scheduled Meds: . aspirin EC  325 mg Oral Daily  . dexamethasone  2 mg Oral 3 times per day  . free water  350 mL Oral Q6H  . heparin  5,000 Units Subcutaneous 3 times per day  . insulin aspart  0-20 Units  Subcutaneous TID WC  . insulin aspart  0-5 Units Subcutaneous QHS  . insulin glargine  16 Units Subcutaneous QHS  . ipratropium-albuterol  3 mL Nebulization TID  . levofloxacin  750 mg Oral Daily  . pantoprazole  40 mg Oral Daily   Continuous Infusions: . dextrose 50 mL/hr at 04/01/14 0401      Time spent: 35 minutes    Ason Heslin  [facility] Pager [phone]. If 7PM-7AM, please contact night-coverage at [email or web address], password [email or web address] 04/03/2014, 10:56 AM  LOS: 3 days

## 2014-04-03 NOTE — Progress Notes (Signed)
Patient blood sugar rechecked after given 15 units of Insulin Aspart. Blood sugar was 421. MD was notified, was advised to recheck within one to two hours. Monia PouchShakenna Kc Summerson. RN

## 2014-04-04 DIAGNOSIS — IMO0001 Reserved for inherently not codable concepts without codable children: Secondary | ICD-10-CM

## 2014-04-04 DIAGNOSIS — E1165 Type 2 diabetes mellitus with hyperglycemia: Secondary | ICD-10-CM

## 2014-04-04 LAB — GLUCOSE, CAPILLARY
GLUCOSE-CAPILLARY: 141 mg/dL — AB (ref 70–99)
GLUCOSE-CAPILLARY: 168 mg/dL — AB (ref 70–99)
GLUCOSE-CAPILLARY: 231 mg/dL — AB (ref 70–99)
GLUCOSE-CAPILLARY: 267 mg/dL — AB (ref 70–99)
GLUCOSE-CAPILLARY: 318 mg/dL — AB (ref 70–99)
Glucose-Capillary: 356 mg/dL — ABNORMAL HIGH (ref 70–99)

## 2014-04-04 LAB — BASIC METABOLIC PANEL
BUN: 30 mg/dL — AB (ref 6–23)
BUN: 32 mg/dL — ABNORMAL HIGH (ref 6–23)
BUN: 37 mg/dL — ABNORMAL HIGH (ref 6–23)
CHLORIDE: 117 meq/L — AB (ref 96–112)
CHLORIDE: 117 meq/L — AB (ref 96–112)
CO2: 24 mEq/L (ref 19–32)
CO2: 22 mEq/L (ref 19–32)
CO2: 22 meq/L (ref 19–32)
Calcium: 8.4 mg/dL (ref 8.4–10.5)
Calcium: 8.3 mg/dL — ABNORMAL LOW (ref 8.4–10.5)
Calcium: 7.9 mg/dL — ABNORMAL LOW (ref 8.4–10.5)
Chloride: 118 mEq/L — ABNORMAL HIGH (ref 96–112)
Creatinine, Ser: 0.75 mg/dL (ref 0.50–1.10)
Creatinine, Ser: 0.85 mg/dL (ref 0.50–1.10)
Creatinine, Ser: 1 mg/dL (ref 0.50–1.10)
GFR calc Af Amer: >90 mL/min (ref >90)
GFR calc Af Amer: 61 mL/min — ABNORMAL LOW (ref >90)
GFR calc non Af Amer: 64 mL/min — ABNORMAL LOW (ref >90)
GFR calc non Af Amer: 53 mL/min — ABNORMAL LOW (ref >90)
GFR, EST AFRICAN AMERICAN: 74 mL/min — AB (ref >90)
GFR, EST NON AFRICAN AMERICAN: 79 mL/min — AB (ref >90)
GLUCOSE: 182 mg/dL — AB (ref 70–99)
Glucose, Bld: 329 mg/dL — ABNORMAL HIGH (ref 70–99)
Glucose, Bld: 382 mg/dL — ABNORMAL HIGH (ref 70–99)
POTASSIUM: 4.5 meq/L (ref 3.7–5.3)
POTASSIUM: 4.7 meq/L (ref 3.7–5.3)
Potassium: 4.3 mEq/L (ref 3.7–5.3)
SODIUM: 152 meq/L — AB (ref 137–147)
Sodium: 154 mEq/L — ABNORMAL HIGH (ref 137–147)
Sodium: 154 mEq/L — ABNORMAL HIGH (ref 137–147)

## 2014-04-04 MED ORDER — DEXAMETHASONE SODIUM PHOSPHATE 4 MG/ML IJ SOLN
2.0000 mg | Freq: Two times a day (BID) | INTRAMUSCULAR | Status: DC
  Administered 2014-04-04 – 2014-04-05 (×2): 2 mg via INTRAVENOUS
  Filled 2014-04-04 (×2): qty 0.5

## 2014-04-04 MED ORDER — DEXTROSE 5 % IV SOLN
INTRAVENOUS | Status: DC
  Administered 2014-04-04: 09:00:00 via INTRAVENOUS

## 2014-04-04 NOTE — Progress Notes (Signed)
04/04/14 1500 Unable to meet with family to provide second Medicare IM letter, as they are involved in making Palliative decisions for possible hospice admission. Patient is not alert and oriented at this time.

## 2014-04-04 NOTE — Progress Notes (Signed)
[facility] PROGRESS NOTE  Diane Rangel ZOX:[id number] DOB: Feb 14, 1934 DOA: 03/31/2014 PCP: Ernestine ConradBLUTH, KIRK, MD   Brief narrative 80 y/o female with recent left ACA stroke with residual right hemiplegia and mental status changes. She was discharged from [facility] to SNF only yesterday. Prior to discharge she had diarrhea and hypernatremia and required rectal tube placement. Barely after arriving to SNF she developed fever and increasing lethargy [facility] Head CT showed evolution of left ACA stoke. Her hypernatremia worsened to 157. MRI brain repeated this am showed evolution of left ACA infarct with 2mm left to right midline  shift and 2 new punctate infarcts over bilateral temporal lobes.   Assessment/Plan: Acute recurrent CVA Evolution of recent left ACA infarct and 2 new infarct on MRI brain ( left temporal and rt parietal ) suggestive of embolic stroke.. Has 2 mm midline shift as well and  Encephalopathy worsened. Seen by swallow eval and recommended dys level 1 diet. Continue ASA suppository. C-started on Decadron for a midline shift . Will discontinue as she is made comfort care.  Stroke w/up done recently. Has por prognosis and cannot participate with PT given extent of stroke. palliative care consulted per family request. Wish to make her comfort care with home hospice. No IV line or blood draws. Provide comfort feeding.     Hypernatremia  Patient has significant hypernatremia. Possibly from dehydration.  Was admitted to [facility] with normal sodium levels. possobly from dehydration. Elevated serum osm. Urine Na normal. Follow urine osm. Placed on D5W and getting free without much improvement.    Fever and shortness of breath Patient afebrile here. resp status stable. Reportedly c/o abdomen pain in ED . abd x ray was unremarkable. Possibly has some bronchitis or early pneumonia. Placed on empiric vanco and cefepime. Ulcers negative. D/c abx    Diabetes mellitus  Elevated fsg.  D/c SSI and lantus  UTI  cx growing klebsiella. Treated with levaquin .           Code Status: DNR, comfort care Family Communication: None at bedside Disposition Plan: d/c home with  hospice vs residential facility   Consultants:  neurology  Procedures:  MRI brian  Antibiotics:  none  HPI/Subjective: Patient remains aphasic. No clinical improvement Objective: Filed Vitals:   04/04/14 1058  BP: 116/61  Pulse: 121  Temp: 97.9 F (36.6 C)  Resp: 20    Intake/Output Summary (Last 24 hours) at 04/04/14 1421 Last data filed at 04/04/14 1300  Gross per 24 hour  Intake      0 ml  Output      0 ml  Net      0 ml   Filed Weights   04/01/14 0000 04/02/14 0500 04/03/14 0518  Weight: 118.7 kg (261 lb 11 oz) 118.7 kg (261 lb 11 oz) 119.4 kg (263 lb 3.7 oz)    Exam:   General:  Elderly female sitting up in bed , has flattened affect  HEENT: no pallor, moist oral mucosa, right  Facial droop  Chest: clear b/l, no added sounds  CVS: NS1&S2, no MRG  Abd: soft, NT, ND, BS+, foley in place Ext: warm, no edema CNS: AAOX0, right facial droop, 3/5 power over LUE,    Data Reviewed: Basic Metabolic Panel:  Recent Labs Lab 04/03/14 0300 04/03/14 1020 04/03/14 1900 04/04/14 0348 04/04/14 1148  NA 156* 152* 155* 154* 154*  K 4.4 4.7 5.4* 4.3 4.5  CL 120* 117* 121* 118* 117*  CO2  24 21 23 24 22   GLUCOSE 329* 442* 248* 182* 329*  BUN 31* 30* 30* 30* 32*  CREATININE 0.79 0.79 0.76 0.75 0.85  CALCIUM 8.4 8.3* 8.4 8.4 8.3*   Liver Function Tests: No results found for this basename: AST, ALT, ALKPHOS, BILITOT, PROT, ALBUMIN,  in the last 168 hours No results found for this basename: LIPASE, AMYLASE,  in the last 168 hours No results found for this basename: AMMONIA,  in the last 168 hours CBC:  Recent Labs Lab 03/29/14 0542 03/30/14 0618 03/31/14 0556 03/31/14 1809  WBC 14.4* 16.0* 11.0* 11.4*  NEUTROABS  --   --   --  8.1*  HGB 12.5 12.2 12.7 13.0   HCT 37.9 36.7 39.5 39.1  MCV 89.2 89.1 90.0 89.7  PLT 207 213 216 211   Cardiac Enzymes: No results found for this basename: CKTOTAL, CKMB, CKMBINDEX, TROPONINI,  in the last 168 hours BNP (last 3 results)  Recent Labs  03/26/14 2049  PROBNP 231.2   CBG:  Recent Labs Lab 04/03/14 1954 04/04/14 0015 04/04/14 0358 04/04/14 0746 04/04/14 1147  GLUCAP 215* 267* 168* 141* 318*    Recent Results (from the past 240 hour(s))  URINE CULTURE     Status: None   Collection Time    03/26/14  9:57 PM      Result Value Ref Range Status   Specimen Description URINE, CATHETERIZED   Final   Special Requests NONE   Final   Culture  Setup Time     Final   Value: 03/27/2014 02:30     Performed at [facility] Count     Final   Value: >=100,000 COLONIES/ML     Performed at [facility]   Culture     Final   Value: KLEBSIELLA PNEUMONIAE     Performed at [facility]   Report Status 03/28/2014 FINAL   Final   Organism ID, Bacteria KLEBSIELLA PNEUMONIAE   Final  CLOSTRIDIUM DIFFICILE BY PCR     Status: None   Collection Time    03/26/14 10:10 PM      Result Value Ref Range Status   C difficile by pcr NEGATIVE  NEGATIVE Final  CULTURE, BLOOD (ROUTINE X 2)     Status: None   Collection Time    03/27/14  9:57 AM      Result Value Ref Range Status   Specimen Description BLOOD RIGHT ANTECUBITAL   Final   Special Requests     Final   Value: BOTTLES DRAWN AEROBIC AND ANAEROBIC AEB=9CC ANA=11CC   Culture NO GROWTH 5 DAYS   Final   Report Status 04/01/2014 FINAL   Final  CULTURE, BLOOD (ROUTINE X 2)     Status: None   Collection Time    03/27/14 10:03 AM      Result Value Ref Range Status   Specimen Description BLOOD LEFT HAND   Final   Special Requests BOTTLES DRAWN AEROBIC AND ANAEROBIC 10CC   Final   Culture NO GROWTH 5 DAYS   Final   Report Status 04/01/2014 FINAL   Final  CULTURE, BLOOD (ROUTINE X 2)     Status: None   Collection Time     03/31/14 12:37 AM      Result Value Ref Range Status   Specimen Description BLOOD RIGHT ARM   Final   Special Requests BOTTLES DRAWN AEROBIC AND ANAEROBIC 10CC   Final   Culture  Setup Time  Final   Value: 04/01/2014 03:38     Performed at [facility]   Culture     Final   Value:        BLOOD CULTURE RECEIVED NO GROWTH TO DATE CULTURE WILL BE HELD FOR 5 DAYS BEFORE ISSUING A FINAL NEGATIVE REPORT     Performed at [facility]   Report Status PENDING   Incomplete  CULTURE, BLOOD (ROUTINE X 2)     Status: None   Collection Time    03/31/14 12:45 AM      Result Value Ref Range Status   Specimen Description BLOOD RIGHT HAND   Final   Special Requests BOTTLES DRAWN AEROBIC ONLY 1CC   Final   Culture  Setup Time     Final   Value: 04/01/2014 03:38     Performed at [facility]   Culture     Final   Value:        BLOOD CULTURE RECEIVED NO GROWTH TO DATE CULTURE WILL BE HELD FOR 5 DAYS BEFORE ISSUING A FINAL NEGATIVE REPORT     Performed at [facility]   Report Status PENDING   Incomplete  URINE CULTURE     Status: None   Collection Time    03/31/14  9:39 PM      Result Value Ref Range Status   Specimen Description URINE, CATHETERIZED   Final   Special Requests NONE   Final   Culture  Setup Time     Final   Value: 03/31/2014 23:51     [facility]formed [facility]   Colony Count     Final   Value: NO GROWTH     [facility]formed at [facility]   Culture     Final   Value: NO GROWTH     [facility]formed at [facility]   Report Status 04/02/2014 FINAL   Final  CLOSTRIDIUM DIFFICILE BY PCR     Status: None   Collection Time    04/01/14  5:22 AM      Result Value Ref Range Status   C difficile by pcr NEGATIVE  NEGATIVE Final     Studies: Dg Chest Port 1 View  04/03/2014   CLINICAL DATA:  Fever.  Rule out aspiration.  EXAM: PORTABLE CHEST - 1 VIEW  COMPARISON:  03/31/2014  FINDINGS: Lung volumes are low. There is minor basilar  atelectasis, stable. Lungs are otherwise clear. Cardiac silhouette is normal in size. No mediastinal or hilar masses. No evidence of adenopathy.  Bony thorax is demineralized but grossly intact.  IMPRESSION: No acute cardiopulmonary disease.   Electronically Signed   By: Amie Portlandavid  Ormond M.D.   On: 04/03/2014 17:05    Scheduled Meds: . aspirin  300 mg Rectal Daily  . dexamethasone  2 mg Intravenous TID  . free water  350 mL Oral Q6H  . heparin  5,000 Units Subcutaneous 3 times per day  . insulin aspart  0-20 Units Subcutaneous Q4H  . insulin aspart  15 Units Subcutaneous Once  . insulin glargine  25 Units Subcutaneous QHS  . ipratropium-albuterol  3 mL Nebulization TID  . pantoprazole  40 mg Oral Daily   Continuous Infusions:      Time spent: 35 minutes    Kamilia Carollo  [facility] Pager [phone]. If 7PM-7AM, please contact night-coverage at [email or web address], password [email or web address] 04/04/2014, 2:21 PM  LOS: 4 days

## 2014-04-04 NOTE — Progress Notes (Signed)
Speech Language Pathology Treatment: Dysphagia  Patient Details Name: Diane Rangel MRN: [id number] DOB: [birth year] Today's Date: 04/04/2014 Time: 2542-7062 SLP Time Calculation (min): 47 min  Assessment / Plan / Recommendation Clinical Impression  Pt with much improved mentation today and was participative in tx.  SLP provided oral care as dried secretions retained - pt with sensitive gag response.   MD please examine pt's lingual musculature - ? Coating that may be consistent with thrush that may further impair her swallow.    Pt assisted to self feed nectar thick juice, thin water, ice cream, applesauce and single cracker bolus.  Poor mastication ability with anterior residuals without pt awareness due to sensorimotor deficits.  Use of icecream to facilitate clearance helpful.  Cueing pt to dry swallow also effective with ice cream and liquids.    Pharyngeal swallow appeared timely without indication of significant stasis.  Throat clear followed by reflexive cough (weak) noted after sequential boluses of thin water - ? Airway penetration.  Pt with clear voice throughout and mild dyspnea toward end of treatment session.  Much improved swallow ability today, however pt continues to become dyspneic easily and is impulsive taking large boluses.  She did benefit from hand over hand assist to self feed and take smaller amounts.  Per stroke team, pt for palliative meeting today and anticipate comfort goal.    SLP educated granddaughter extensively to diet recommendations, swallow precautions/compensations to mitigate aspiration and increase pt comfort with intake.  Advised to use thickener if decreases pt's cough with intake and to modify intake based on pt's swallow performance.    Recommend puree/thin *occasional soft solid as tolerate* for comfort.  All education is completed and all goals met.  Thanks for allowing me to help with this pt's care.    HPI HPI: Pt is a 80 year-old female with pmhx of  HTN, diabetes mellitus, recent left ACA stroke and dysphagia.  She was brought in from a nursing home ([facility] per family) where she was recently admitted to from the hospital.  She was started on an oral diet with dysphagia monitoring at the nursing home.  Family reports one incident of pt receiving thin liquids via cup d/t lack of communication that pt was on nectar-thickened liquids.  Pt was at her baseline with dependence with ADL's.  She recently had a MBS indicating need for nectar-thickened liquids and a Dysphagia 1 diet.  BSE completed over the weekend recommended puree/honey thick liquid diet.  Today pt seen to assess po tolerance and readiness for dietary advancement or instrumental evaluation.  Granddaughter Candace present and reports pt with decreased function over the weekend.  MRI 4/25 showed evolving changes, left ACA, two new embolic cva's .  MBS was completed 4/23.  CT abdomen showed retained stool in right colon, gaseous distention of colon.     Pertinent Vitals Afebrile,decreased  SLP Plan  All goals met    Recommendations Diet recommendations: Dysphagia 1 (puree);Thin liquid;Nectar-thick liquid Medication Administration: Via alternative means Supervision: Full supervision/cueing for compensatory strategies;Staff to assist with self feeding Compensations: Slow rate;Small sips/bites;Check for pocketing (oral suction before and after po intake due to oral deficits) Postural Changes and/or Swallow Maneuvers: Out of bed for meals;Seated upright 90 degrees;Upright 30-60 min after meal              Oral Care Recommendations: Oral care Q4 per protocol Follow up Recommendations: None Plan: All goals met    [place]  Luanna Salk, [facility] SLP [phone]

## 2014-04-04 NOTE — Progress Notes (Signed)
UR complete.  Josie Mesa RN, MSN 

## 2014-04-04 NOTE — Consult Note (Signed)
Met with patient's son, grandaughter and another son by phone. Desire shift towards comfort care-deciding bewteen home with hospice and hospice facility- will let me know in AM.  Comfort Feeding  Treat Thrush for comfort and PO intake  No IV sticks or blood draws  Pain and anxiety PRNs  Full note to follow.  Lane Hacker, DO Palliative Medicine

## 2014-04-04 NOTE — Progress Notes (Signed)
[facility] ROUNDING NOTE   Subjective:   Interval History: no changes noted  Objective:  Vital signs in last 24 hours:  Temp:  [97.9 F (36.6 C)-99.9 F (37.7 C)] 99.4 F (37.4 C) (04/28 0512) Pulse Rate:  [103-114] 103 (04/28 0512) Resp:  [20-22] 20 (04/28 0512) BP: (130-156)/(50-70) 130/50 mmHg (04/28 0512) SpO2:  [96 %-99 %] 96 % (04/28 0512)  Weight change:  Filed Weights   04/01/14 0000 04/02/14 0500 04/03/14 0518  Weight: 118.7 kg (261 lb 11 oz) 118.7 kg (261 lb 11 oz) 119.4 kg (263 lb 3.7 oz)    Intake/Output: I/O last 3 completed shifts: In: 420 [P.O.:420] Out: 700 [Urine:700]   Intake/Output this shift:    mucous membranes are dry CVS- RRR RS- CTA ABD- BS present soft non-distended EXT- no edema   Basic Metabolic Panel:  Recent Labs Lab 04/02/14 1821 04/03/14 0300 04/03/14 1020 04/03/14 1900 04/04/14 0348  NA 154* 156* 152* 155* 154*  K 3.9 4.4 4.7 5.4* 4.3  CL 119* 120* 117* 121* 118*  CO2 23 24 21 23 24   GLUCOSE 386* 329* 442* 248* 182*  BUN 29* 31* 30* 30* 30*  CREATININE 0.78 0.79 0.79 0.76 0.75  CALCIUM 8.3* 8.4 8.3* 8.4 8.4    Liver Function Tests: No results found for this basename: AST, ALT, ALKPHOS, BILITOT, PROT, ALBUMIN,  in the last 168 hours No results found for this basename: LIPASE, AMYLASE,  in the last 168 hours No results found for this basename: AMMONIA,  in the last 168 hours  CBC:  Recent Labs Lab 03/29/14 0542 03/30/14 0618 03/31/14 0556 03/31/14 1809  WBC 14.4* 16.0* 11.0* 11.4*  NEUTROABS  --   --   --  8.1*  HGB 12.5 12.2 12.7 13.0  HCT 37.9 36.7 39.5 39.1  MCV 89.2 89.1 90.0 89.7  PLT 207 213 216 211    Cardiac Enzymes: No results found for this basename: CKTOTAL, CKMB, CKMBINDEX, TROPONINI,  in the last 168 hours  BNP: No components found with this basename: POCBNP,   CBG:  Recent Labs Lab 04/03/14 1554 04/03/14 1954 04/04/14 0015 04/04/14 0358 04/04/14 0746  GLUCAP 288*  215* 267* 168* 141*    Microbiology: Results for orders placed during the hospital encounter of 03/31/14  CULTURE, BLOOD (ROUTINE X 2)     Status: None   Collection Time    03/31/14 12:37 AM      Result Value Ref Range Status   Specimen Description BLOOD RIGHT ARM   Final   Special Requests BOTTLES DRAWN AEROBIC AND ANAEROBIC 10CC   Final   Culture  Setup Time     Final   Value: 04/01/2014 03:38     Performed at [facility]   Culture     Final   Value:        BLOOD CULTURE RECEIVED NO GROWTH TO DATE CULTURE WILL BE HELD FOR 5 DAYS BEFORE ISSUING A FINAL NEGATIVE REPORT     Performed at [facility]   Report Status PENDING   Incomplete  CULTURE, BLOOD (ROUTINE X 2)     Status: None   Collection Time    03/31/14 12:45 AM      Result Value Ref Range Status   Specimen Description BLOOD RIGHT HAND   Final   Special Requests BOTTLES DRAWN AEROBIC ONLY 1CC   Final   Culture  Setup Time     Final   Value: 04/01/2014 03:38  Performed at [facility]   Culture     Final   Value:        BLOOD CULTURE RECEIVED NO GROWTH TO DATE CULTURE WILL BE HELD FOR 5 DAYS BEFORE ISSUING A FINAL NEGATIVE REPORT     Performed at [facility]   Report Status PENDING   Incomplete  URINE CULTURE     Status: None   Collection Time    03/31/14  9:39 PM      Result Value Ref Range Status   Specimen Description URINE, CATHETERIZED   Final   Special Requests NONE   Final   Culture  Setup Time     Final   Value: 03/31/2014 23:51     Performed at [facility]   Colony Count     Final   Value: NO GROWTH     Performed at [facility]   Culture     Final   Value: NO GROWTH     Performed at [facility]   Report Status 04/02/2014 FINAL   Final  CLOSTRIDIUM DIFFICILE BY PCR     Status: None   Collection Time    04/01/14  5:22 AM      Result Value Ref Range Status   C difficile by pcr NEGATIVE  NEGATIVE Final    Coagulation Studies: No results  found for this basename: LABPROT, INR,  in the last 72 hours  Urinalysis: No results found for this basename: COLORURINE, APPERANCEUR, LABSPEC, PHURINE, GLUCOSEU, HGBUR, BILIRUBINUR, KETONESUR, PROTEINUR, UROBILINOGEN, NITRITE, LEUKOCYTESUR,  in the last 72 hours    Imaging: Dg Chest Port 1 View  04/03/2014   CLINICAL DATA:  Fever.  Rule out aspiration.  EXAM: PORTABLE CHEST - 1 VIEW  COMPARISON:  03/31/2014  FINDINGS: Lung volumes are low. There is minor basilar atelectasis, stable. Lungs are otherwise clear. Cardiac silhouette is normal in size. No mediastinal or hilar masses. No evidence of adenopathy.  Bony thorax is demineralized but grossly intact.  IMPRESSION: No acute cardiopulmonary disease.   Electronically Signed   By: Amie Portlandavid  Ormond M.D.   On: 04/03/2014 17:05     Medications:   . dextrose 100 mL/hr at 04/04/14 0844   . aspirin  300 mg Rectal Daily  . ceFEPime (MAXIPIME) IV  2 g Intravenous Q12H  . dexamethasone  2 mg Intravenous TID  . free water  350 mL Oral Q6H  . heparin  5,000 Units Subcutaneous 3 times per day  . insulin aspart  0-20 Units Subcutaneous Q4H  . insulin aspart  15 Units Subcutaneous Once  . insulin glargine  25 Units Subcutaneous QHS  . ipratropium-albuterol  3 mL Nebulization TID  . pantoprazole  40 mg Oral Daily  . 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 [facility] was discharged from [facility] to [facility] only yesterday. Prior to discharge she had diarrhea and hypernatremia and required rectal tube placement. Barely after arriving to SNF she developed fever and increasing lethargy and [facility] Head CT showed evolution of left ACA stoke. Her hypernatremia  Slowly improved  1. Hypernatremia with a free water deficit approaching about 4 L    I change to D5 W at 100 cc/hr    LOS: 4 Garnetta BuddyMartin W Lyanne Kates @TODAY @10 :02  AM

## 2014-04-04 NOTE — Clinical Social Work Note (Signed)
Clinical Social Work Department BRIEF PSYCHOSOCIAL ASSESSMENT 04/04/2014  Patient:  Diane Rangel,Diane Rangel     Account Number:  [id number]     Admit date:  03/31/2014  Clinical Social Worker:  Verl BlalockSCINTO,JESSE, LCSW  Date/Time:  04/04/2014 12:30 PM  Referred by:  Physician  Date Referred:  04/04/2014 Referred for  SNF Placement  Other - See comment   Other Referral:   Residential Hospice vs. Home with Hospice   Interview type:  Family Other interview type:   Patient granddaughter over the phone    PSYCHOSOCIAL DATA Living Status:  FACILITY Admitted from facility:  [facility] Level of care:  Skilled Nursing Facility Primary support name:  Diane Rangel,Diane Rangel  [phone] Primary support relationship to patient:  FAMILY Degree of support available:   Strong    CURRENT CONCERNS Current Concerns  Post-Acute Placement  Adjustment to Illness   Other Concerns:    SOCIAL WORK ASSESSMENT / PLAN Clinical Social Worker spoke with patient granddaughter over the phone to offer support and briefly discuss patient plans at discharge.  Patient granddaughter states that [facility] prior to onset of new symptoms and return to hospital. Patient has had a rapid decline per documentation and patient family has expressed interest in Palliative Care meeting.    CSW spoke with Palliative MD regarding Palliative Meeting, and patient family has expressed their wishes to further discuss over night and make the determination for patient to go home with Hospice or Residential Hospice placement. CSW spoke with patient granddaughter once again over the phone who was very appreciative of CSW follow up and support.  Patient granddaughter states that they will discuss as a family and provide Palliative MD with decision tomorrow morning.  Patient granddaughter has contacted [facility] to notify that patient will not return.  CSW will follow up with Palliative MD  to further discuss patient family decisions and make appropriate discharge arrangements.  CSW remains available for support and to facilitate patient discharge needs once medically stable.   Assessment/plan status:  Psychosocial Support/Ongoing Assessment of Needs Other assessment/ plan:   Information/referral to community resources:   Clinical Social Worker contacted Palliative MD who was able to arrange family meeting early this morning to meet the needs of patient family.  Patient granddaughter appreciative for timely discussion regarding plan of care.    PATIENT'S/FAMILY'S RESPONSE TO PLAN OF CARE: Patient oriented to self only.  Patient with a very supportive family who seem to be realistic and coping well with patient rapid medical decline.  Patient granddaughter is the spokesperson for the family and she is certainly looking to patient best interest.  Patient has been transitioned to comfort care and family to further discuss disposition for discharge.  Patient family awaiting on additional family member to arrive today.  Patient family verbalized their appreciation for CSW support and concern.

## 2014-04-04 NOTE — Progress Notes (Signed)
Stroke Team Progress Note  HISTORY Diane Rangel is a 80 y.o. female recently admitted 03/26/2014 to 03/31/2014 with severe diarrhea, syncope, and dehydration. A CT of the head on 4/19 showed no acute changes. An MRI/MRA was performed on 03/27/2014 secondary to mental status changes.  The MRI revealed an acute moderate to large sized medial left frontal lobe non-hemorrhagic infarct extending into the parietal lobe in the left anterior cerebral artery distribution. She was also noted to have remote small basal ganglia and thalamic infarcts. The MRA showed an abrupt cut off of flow in the proximal A2 segment of the left anterior cerebral artery consistent with the patient's infarct. An occluded right vertebral artery was also identified.   Dr Gerilyn Pilgrimoonquah saw the patient in consultation on 03/28/2014 and noted right hemiparesis as well as encephalopathy. The patient had been placed on aspirin 81 mg daily for secondary stroke prophylaxis. Her encephalopathy was felt to be multifactorial in the setting of a UTI, sepsis, dehydration, and stroke. She was eventually discharged to a skilled nursing facility on 03/31/2014 only to [facility] with fever and increasing lethargy. An MRI was repeated on 04/01/2014 which showed evolutionary changes of her left anterior cerebral artery territory infarct with a left to right midline shift to 2 mm. There were also 2 new punctate acute infarcts. Neurology was asked to consult.   Date last known well: Date: 03/26/2014  Time last known well: Unable to determine  tPA Given: No: late presentation.   SUBJECTIVE Her granddaughter/primary caregiver is at bedside. ST also at bedside. Granddaughter and son discussed comfort care yesterday. They are meeting with palliative care later today. Granddaughter states they are leaning toward comfort care.   OBJECTIVE Most recent Vital Signs: Filed Vitals:   04/03/14 1841 04/03/14 2117 04/04/14 0111 04/04/14 0512   BP: 156/63 140/70 138/66 130/50  Pulse: 113 106 114 103  Temp: 99.1 F (37.3 C) 98 F (36.7 C) 99.7 F (37.6 C) 99.4 F (37.4 C)  TempSrc: Oral Oral Oral Oral  Resp: 20 20 22 20   Height:      Weight:      SpO2: 97% 99% 99% 96%   CBG (last 3)   Recent Labs  04/04/14 0015 04/04/14 0358 04/04/14 0746  GLUCAP 267* 168* 141*    IV Fluid Intake:   . dextrose 100 mL/hr at 04/04/14 0844    MEDICATIONS  . aspirin  300 mg Rectal Daily  . ceFEPime (MAXIPIME) IV  2 g Intravenous Q12H  . dexamethasone  2 mg Intravenous TID  . free water  350 mL Oral Q6H  . heparin  5,000 Units Subcutaneous 3 times per day  . insulin aspart  0-20 Units Subcutaneous Q4H  . insulin aspart  15 Units Subcutaneous Once  . insulin glargine  25 Units Subcutaneous QHS  . ipratropium-albuterol  3 mL Nebulization TID  . pantoprazole  40 mg Oral Daily  . vancomycin  1,250 mg Intravenous Q12H   PRN:  food thickener, hydrALAZINE  Diet:  NPO  Activity:  Bathroom privileges with assistance DVT Prophylaxis:  Subcutaneous heparin  CLINICALLY SIGNIFICANT STUDIES Basic Metabolic Panel:   Recent Labs Lab 04/03/14 1900 04/04/14 0348  NA 155* 154*  K 5.4* 4.3  CL 121* 118*  CO2 23 24  GLUCOSE 248* 182*  BUN 30* 30*  CREATININE 0.76 0.75  CALCIUM 8.4 8.4   Liver Function Tests: No results found for this basename: AST, ALT, ALKPHOS, BILITOT, PROT, ALBUMIN,  in the last 168 hours CBC:   Recent Labs Lab 03/31/14 0556 03/31/14 1809  WBC 11.0* 11.4*  NEUTROABS  --  8.1*  HGB 12.7 13.0  HCT 39.5 39.1  MCV 90.0 89.7  PLT 216 211   Coagulation: No results found for this basename: LABPROT, INR,  in the last 168 hours Cardiac Enzymes: No results found for this basename: CKTOTAL, CKMB, CKMBINDEX, TROPONINI,  in the last 168 hours Urinalysis:   Recent Labs Lab 03/31/14 2139  COLORURINE YELLOW  LABSPEC 1.024  PHURINE 5.5  GLUCOSEU 100*  HGBUR TRACE*  BILIRUBINUR NEGATIVE  KETONESUR NEGATIVE   PROTEINUR NEGATIVE  UROBILINOGEN 1.0  NITRITE NEGATIVE  LEUKOCYTESUR NEGATIVE   Lipid Panel    Component Value Date/Time   CHOL 153 03/27/2014 0626   TRIG 144 03/27/2014 0626   HDL 33* 03/27/2014 0626   CHOLHDL 4.6 03/27/2014 0626   VLDL 29 03/27/2014 0626   LDLCALC 91 03/27/2014 0626   HgbA1C  Lab Results  Component Value Date   HGBA1C 6.8* 03/27/2014    Urine Drug Screen:     Component Value Date/Time   LABOPIA NONE DETECTED 03/26/2014 2157   COCAINSCRNUR NONE DETECTED 03/26/2014 2157   LABBENZ NONE DETECTED 03/26/2014 2157   AMPHETMU NONE DETECTED 03/26/2014 2157   THCU NONE DETECTED 03/26/2014 2157   LABBARB NONE DETECTED 03/26/2014 2157    Alcohol Level: No results found for this basename: ETH,  in the last 168 hours  Ct Head Wo Contrast 03/31/2014    Evolving left ACA distribution infarct.    Mr Brain Wo Contrast 04/01/2014    Evolutionary changes of the previously seen left anterior cerebral artery territory infarction. Petechial blood products within the region of infarction without frank hematoma. Swelling with left-to-right midline shift of 2 mm. Some extension of the infarction into the splenium of the corpus callosum.  Two new punctate acute infarctions, 1 within the left posterior temporal lobe in the other within the right parietal lobe. New development of these infarctions raises the possibility of micro embolic disease.     Dg Chest Portable 1 View 03/31/2014    No significant interval change in the appearance of the chest. Persistent very low inspiratory volumes with bibasilar atelectasis.     Dg Abd Portable 1v 03/31/2014    Air-filled loops of bowel throughout abdomen greater on left, majority colonic though a few bowel loops in the left lower quadrant are indeterminate for sigmoid colon versus small bowel.  No definite bowel wall thickening.  If patient has persistent symptoms, consider CT imaging to exclude small bowel dilatation.     2D Echocardiogram  poor  acoustic window.   Carotid Doppler  Bilateral carotid bifurcation and proximal ICA plaque, resulting in less than 50% diameter stenosis. The exam does not exclude plaque ulceration or embolization.   CXR  03/31/2014 No significant interval change in the appearance of the chest. Persistent very low inspiratory volumes with bibasilar atelectasis.  EKG sinus tachycardia rate 104 beats per minute. For complete results please see formal report.   Therapy Recommendations SNF  Physical Exam   Mental Status:  The patient is lethargic. She follows only some simple commands. She is aphasic and unable to answer most questions. Speaks only a few words. Cranial Nerves:  II: Discs not visualized; Visual fields grossly normal, pupils equal, round, and sluggish.  III,IV, VI: ptosis not present - the patient was unable to follow commands for eom testing.  V,VII: right lower facial droop, facial  light touch sensation normal bilaterally  VIII: hearing normal bilaterally  IX,X: gag reflex present  XI: bilateral shoulder shrug could not be tested  XII: the patient would not extend her tongue.  Motor:  Right : Upper extremity 0/5 Left: Upper extremity 3-4/5 (generalized weakness on the left)  Lower extremity 0/5 Lower extremity 2/5  Tone and bulk:normal tone throughout; no atrophy noted  Sensory: light touch intact throughout, bilaterally  Deep Tendon Reflexes: lower extremity reflexes absent. Upper extremities 2+ bilaterally.  Plantars:  Right: upgoing Left: downgoing  Cerebellar:  Unable to test  Gait: unable to ambulate   ASSESSMENT Ms. ROXANE PUERTO is a 80 y.o. female presenting with right hemiplegia and aphasia. An MRI done at time of initial admission revealed a left anterior cerebral artery territory infarct due to A1 occlusion (large vessel) repeat MRI revealed 2 new punctate infarct - 1 L posterior temporal lobe and 1 right parietal lobe. Cytotoxic cerebral edema. Newest iInfarcts felt  secondary to emboli of unknown source. On aspirin 81 mg orally every day prior to admission. Now on aspirin 300 mg suppository for secondary stroke prevention. Patient with resultant right hemiplegia, aphasia, dysphagia. Given NPO status and resultant poor quality of life, no further stroke workup indicated.    Dysphagia worsening with mild respiratory distress, possible aspiration with pnemonia, WBC 11.4 hypertension Diabetes, HgbA1c 6.8, goal < 7.0  Recent severe diarrhea and dehydration  Recent urinary tract infection  Electrolyte abnormalities - hypernatremia 154, hypochloridemia 117, hyperglycemia 329  Previous strokes  Cholesterol 153; LDL 91  Obesity, Body mass index is 38.85 kg/(m^2).   Hospital day # 4  TREATMENT/PLAN  Continue  Aspirin suppositories for secondary stroke prevention   Therapy recommends SNF  Agree with plans for palliative care conference and support comfort care if family agrees. If they do not agree, will need PEG and SNF placement  No further stroke workup indicated.  Patient has a 10-15% risk of having another stroke over the next year, the highest risk is within 2 weeks of the most recent stroke/TIA (risk of having a stroke following a stroke or TIA is the same).  Stroke Service will sign off. Please call should any needs arise.  Follow up with Dr. Pearlean Brownie should aggressive care be desired.  Annie Main, MSN, RN, ANVP-BC, ANP-BC, [facility] Pager: [phone] 04/04/2014 10:19 AM  I have personally obtained a history, examined the patient, evaluated imaging results, and formulated the assessment and plan of care. I agree with the above. Delia Heady, MD  To contact Stroke Continuity provider, please refer to [email or web address]. After hours, contact General Neurology

## 2014-04-05 LAB — GLUCOSE, CAPILLARY
GLUCOSE-CAPILLARY: 321 mg/dL — AB (ref 70–99)
GLUCOSE-CAPILLARY: 451 mg/dL — AB (ref 70–99)
GLUCOSE-CAPILLARY: 361 mg/dL — AB (ref 70–99)
Glucose-Capillary: 341 mg/dL — ABNORMAL HIGH (ref 70–99)
Glucose-Capillary: 352 mg/dL — ABNORMAL HIGH (ref 70–99)

## 2014-04-05 LAB — BASIC METABOLIC PANEL
BUN: 46 mg/dL — ABNORMAL HIGH (ref 6–23)
CALCIUM: 8.3 mg/dL — AB (ref 8.4–10.5)
CO2: 23 mEq/L (ref 19–32)
Chloride: 120 mEq/L — ABNORMAL HIGH (ref 96–112)
Creatinine, Ser: 1.12 mg/dL — ABNORMAL HIGH (ref 0.50–1.10)
GFR calc Af Amer: 53 mL/min — ABNORMAL LOW (ref >90)
GFR calc non Af Amer: 46 mL/min — ABNORMAL LOW (ref >90)
Glucose, Bld: 385 mg/dL — ABNORMAL HIGH (ref 70–99)
Potassium: 4.6 mEq/L (ref 3.7–5.3)
SODIUM: 157 meq/L — AB (ref 137–147)

## 2014-04-05 MED ORDER — LORAZEPAM 2 MG/ML IJ SOLN
1.0000 mg | INTRAMUSCULAR | Status: DC | PRN
  Administered 2014-04-06: 1 mg via INTRAVENOUS
  Filled 2014-04-05: qty 1

## 2014-04-05 MED ORDER — MORPHINE SULFATE (CONCENTRATE) 10 MG /0.5 ML PO SOLN
5.0000 mg | Freq: Two times a day (BID) | ORAL | Status: DC
  Administered 2014-04-05 – 2014-04-06 (×3): 5 mg via ORAL
  Filled 2014-04-05 (×3): qty 0.5

## 2014-04-05 MED ORDER — SCOPOLAMINE 1 MG/3DAYS TD PT72
1.0000 | MEDICATED_PATCH | TRANSDERMAL | Status: DC
  Administered 2014-04-05: 1.5 mg via TRANSDERMAL
  Filled 2014-04-05: qty 1

## 2014-04-05 MED ORDER — MAGIC MOUTHWASH
10.0000 mL | Freq: Four times a day (QID) | ORAL | Status: DC
  Administered 2014-04-05 – 2014-04-06 (×4): 10 mL via ORAL
  Filled 2014-04-05 (×8): qty 10

## 2014-04-05 MED ORDER — INSULIN ASPART 100 UNIT/ML ~~LOC~~ SOLN
12.0000 [IU] | Freq: Once | SUBCUTANEOUS | Status: AC
  Administered 2014-04-05: 12 [IU] via SUBCUTANEOUS

## 2014-04-05 MED ORDER — LORAZEPAM 2 MG/ML PO CONC
1.0000 mg | Freq: Two times a day (BID) | ORAL | Status: DC
  Administered 2014-04-05 – 2014-04-06 (×2): 1 mg via ORAL
  Filled 2014-04-05: qty 1
  Filled 2014-04-05: qty 0.5
  Filled 2014-04-05: qty 1

## 2014-04-05 MED ORDER — MORPHINE SULFATE (CONCENTRATE) 10 MG /0.5 ML PO SOLN
10.0000 mg | ORAL | Status: DC | PRN
  Administered 2014-04-05: 5 mg via ORAL
  Filled 2014-04-05: qty 0.5

## 2014-04-05 NOTE — Progress Notes (Addendum)
Page sent to N. Dhungel regarding patient's blood sugar. Awaiting call back.  1245: paged again  Orders recieved

## 2014-04-05 NOTE — Progress Notes (Signed)
Physical Therapy Discharge Patient Details Name: Diane Rangel MRN: [id number] DOB: March 28, 1934 Today's Date: 04/05/2014 Time:  -     Patient discharged from PT services secondary to now moving toward comfort care only.  Will sign off at this time..   04/05/2014  Diane Rangel, PT [phone]  (pager)  GP     Diane Rangel 04/05/2014, 2:44 PM

## 2014-04-05 NOTE — Clinical Social Work Note (Signed)
Clinical Social Worker continuing to follow patient and family for support and discharge planning needs.  CSW spoke with patient granddaughter, Williemae NatterCandice, who states that patient family has made arrangements for patient to return home with Hospice.  [facility] - CM notified and aware.  CSW assured patient granddaughter that CM will assist with home hospice arrangements.  Patient granddaughter very appreciative and hopeful to have patient return home tomorrow.  Patient will likely need ambulance transport at discharge - CSW to work with CM to arrange PTAR home once medically appropriate and Hospice arrangements made.  Clinical Social Worker will sign off for now as social work intervention is no longer needed. Please consult us again if new need arises.  Macario GoldsJesse Akshitha Culmer, [place]CS[place] [phone]

## 2014-04-05 NOTE — Progress Notes (Signed)
[facility] ROUNDING NOTE   Subjective:   Interval History: patient comfortable   Objective:  Vital signs in last 24 hours:  Temp:  [97.7 F (36.5 C)-99.1 F (37.3 C)] 98 F (36.7 C) (04/29 1000) Pulse Rate:  [103-130] 109 (04/29 1000) Resp:  [20-28] 20 (04/29 1000) BP: (105-154)/(52-83) 121/56 mmHg (04/29 1000) SpO2:  [95 %-99 %] 98 % (04/29 1000) Weight:  [118.117 kg (260 lb 6.4 oz)] 118.117 kg (260 lb 6.4 oz) (04/29 0603)  Weight change:  Filed Weights   04/02/14 0500 04/03/14 0518 04/05/14 0603  Weight: 118.7 kg (261 lb 11 oz) 119.4 kg (263 lb 3.7 oz) 118.117 kg (260 lb 6.4 oz)    Intake/Output: I/O last 3 completed shifts: In: 360 [P.O.:360] Out: -    Intake/Output this shift:     mucous membranes are dry  CVS- RRR  RS- CTA  ABD- BS present soft non-distended  EXT- no edema   Basic Metabolic Panel:  Recent Labs Lab 04/03/14 1900 04/04/14 0348 04/04/14 1148 04/04/14 1851 04/05/14 0420  NA 155* 154* 154* 152* 157*  K 5.4* 4.3 4.5 4.7 4.6  CL 121* 118* 117* 117* 120*  CO2 23 24 22 22 23   GLUCOSE 248* 182* 329* 382* 385*  BUN 30* 30* 32* 37* 46*  CREATININE 0.76 0.75 0.85 1.00 1.12*  CALCIUM 8.4 8.4 8.3* 7.9* 8.3*    Liver Function Tests: No results found for this basename: AST, ALT, ALKPHOS, BILITOT, PROT, ALBUMIN,  in the last 168 hours No results found for this basename: LIPASE, AMYLASE,  in the last 168 hours No results found for this basename: AMMONIA,  in the last 168 hours  CBC:  Recent Labs Lab 03/30/14 0618 03/31/14 0556 03/31/14 1809  WBC 16.0* 11.0* 11.4*  NEUTROABS  --   --  8.1*  HGB 12.2 12.7 13.0  HCT 36.7 39.5 39.1  MCV 89.1 90.0 89.7  PLT 213 216 211    Cardiac Enzymes: No results found for this basename: CKTOTAL, CKMB, CKMBINDEX, TROPONINI,  in the last 168 hours  BNP: No components found with this basename: POCBNP,   CBG:  Recent Labs Lab 04/04/14 1147 04/04/14 1604 04/04/14 2000  04/04/14 2359 04/05/14 0434  GLUCAP 318* 231* 356* 352* 321*    Microbiology: Results for orders placed during the hospital encounter of 03/31/14  CULTURE, BLOOD (ROUTINE X 2)     Status: None   Collection Time    03/31/14 12:37 AM      Result Value Ref Range Status   Specimen Description BLOOD RIGHT ARM   Final   Special Requests BOTTLES DRAWN AEROBIC AND ANAEROBIC 10CC   Final   Culture  Setup Time     Final   Value: 04/01/2014 03:38     Performed at [facility]   Culture     Final   Value:        BLOOD CULTURE RECEIVED NO GROWTH TO DATE CULTURE WILL BE HELD FOR 5 DAYS BEFORE ISSUING A FINAL NEGATIVE REPORT     Performed at [facility]   Report Status PENDING   Incomplete  CULTURE, BLOOD (ROUTINE X 2)     Status: None   Collection Time    03/31/14 12:45 AM      Result Value Ref Range Status   Specimen Description BLOOD RIGHT HAND   Final   Special Requests BOTTLES DRAWN AEROBIC ONLY 1CC   Final   Culture  Setup Time  Final   Value: 04/01/2014 03:38     Performed at [facility]   Culture     Final   Value:        BLOOD CULTURE RECEIVED NO GROWTH TO DATE CULTURE WILL BE HELD FOR 5 DAYS BEFORE ISSUING A FINAL NEGATIVE REPORT     Performed at [facility]   Report Status PENDING   Incomplete  URINE CULTURE     Status: None   Collection Time    03/31/14  9:39 PM      Result Value Ref Range Status   Specimen Description URINE, CATHETERIZED   Final   Special Requests NONE   Final   Culture  Setup Time     Final   Value: 03/31/2014 23:51     Performed at [facility]   Colony Count     Final   Value: NO GROWTH     Performed at [facility]   Culture     Final   Value: NO GROWTH     Performed at [facility]   Report Status 04/02/2014 FINAL   Final  CLOSTRIDIUM DIFFICILE BY PCR     Status: None   Collection Time    04/01/14  5:22 AM      Result Value Ref Range Status   C difficile by pcr NEGATIVE  NEGATIVE  Final    Coagulation Studies: No results found for this basename: LABPROT, INR,  in the last 72 hours  Urinalysis: No results found for this basename: COLORURINE, APPERANCEUR, LABSPEC, PHURINE, GLUCOSEU, HGBUR, BILIRUBINUR, KETONESUR, PROTEINUR, UROBILINOGEN, NITRITE, LEUKOCYTESUR,  in the last 72 hours    Imaging: Dg Chest Port 1 View  04/03/2014   CLINICAL DATA:  Fever.  Rule out aspiration.  EXAM: PORTABLE CHEST - 1 VIEW  COMPARISON:  03/31/2014  FINDINGS: Lung volumes are low. There is minor basilar atelectasis, stable. Lungs are otherwise clear. Cardiac silhouette is normal in size. No mediastinal or hilar masses. No evidence of adenopathy.  Bony thorax is demineralized but grossly intact.  IMPRESSION: No acute cardiopulmonary disease.   Electronically Signed   By: Amie Portlandavid  Ormond M.D.   On: 04/03/2014 17:05     Medications:     . aspirin  300 mg Rectal Daily  . dexamethasone  2 mg Intravenous Q12H  . ipratropium-albuterol  3 mL Nebulization TID  . LORazepam  1 mg Oral BID  . magic mouthwash  10 mL Oral QID  . morphine CONCENTRATE  5 mg Oral Q12H  . pantoprazole  40 mg Oral Daily  . 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 She was discharged from [facility] to [facility] only yesterday. Prior to discharge she had diarrhea and hypernatremia and required rectal tube placement. Barely after arriving to SNF she developed fever and increasing lethargy and [facility] showed evolution of left ACA stoke.    1. Hypernatremia with a free water deficit approaching about 4 L - 5 L  No longer receiving IV fluids . Palliative care consulted and patient being transitioned to hospice.   I shall sign off today      LOS: 5 Garnetta BuddyMartin W Kye Hedden @TODAY @11 :43 AM

## 2014-04-05 NOTE — Care Management Note (Unsigned)
    Page 1 of 2   04/06/2014     10:55:27 AM CARE MANAGEMENT NOTE 04/06/2014  Patient:  Diane Rangel,Diane Rangel   Account Number:  [id number]  Date Initiated:  04/04/2014  Documentation initiated by:  Elmer BalesOBARGE,COURTNEY  Subjective/Objective Assessment:   Patient admitted with recurrent/evolving CVA, possible HCAP. Residing at SNF prior to admission following a recent CVA.     Action/Plan:   Will follow for discharge needs   Anticipated DC Date:     Anticipated DC Plan:  SKILLED NURSING FACILITY  In-house referral  Clinical Social Worker      DC Planning Services  CM consult      Choice offered to / List presented to:  C-4 Adult Children   DME arranged  AIR OVERLAY MATTRESS  HOSPITAL BED  OXYGEN           HH agency  [facility]   Status of service:  Completed, signed off Medicare Important Message given?  NO (If response is "NO", the following Medicare IM given date fields will be blank) Date Medicare IM given:   Date Additional Medicare IM given:    Discharge Disposition:  HOME W HOSPICE CARE  Per UR Regulation:  Reviewed for med. necessity/level of care/duration of stay  If discussed at Long Length of Stay Meetings, dates discussed:    Comments:  04/06/14 1045 Elmer Balesourtney Robarge RN, MSN, CM- Spoke with Scientist, product/process developmentgrandaughter Diane via phone, who states that DME has arrived in the home.  She states that her father will be coming to the hospital to go over any discharge paperwork and that family is hoping to have patient transported home as soon as it can be arrranged.  CM spoke with CSW to notify that patient has been discharged and family is ready.  CM notified Dr Gonzella Lexhungel of need for scripts and signed DNR form.   04/05/14 1130 Elmer Balesourtney Robarge RN, MSN, CM- Recieved a call from CSW stating that family has chosen to take patient home with hospice.  CM spoke with patient's grandaughter Diane Rangel via phone [phone] , who will be her caretaker.  [facility]. This information was relayed to Dr Gonzella Lexhungel, orders were placed.  [facility], who has accepted the referral.  The following DME was requested: Hospital bed with overlay mattress, overbed table, and oxygen, which hospice will arrange.  CM requested that family notify when the equipment has arrived to ensure that everything is in place prior to discharge. Per conversation with Dr Gonzella Lexhungel, anticipate discharge home tomorrow.  CSW is aware of need for transport home.   04/04/14 1500 Unable to meet with family to provide second IM letter, as they are involved in making Palliative decisions for possible hospice admission.

## 2014-04-05 NOTE — Progress Notes (Signed)
[facility] PROGRESS NOTE  Diane Rangel ZOX:[id number] DOB: [birth year] DOA: 03/31/2014 PCP: Ernestine ConradBLUTH, KIRK, MD   Brief narrative 80 y/o female with recent left ACA stroke with residual right hemiplegia and mental status changes. She was discharged from [facility] only yesterday. Prior to discharge she had diarrhea and hypernatremia and required rectal tube placement. Barely after arriving to SNF she developed fever and increasing lethargy and [facility] Head CT showed evolution of left ACA stoke. Her hypernatremia worsened to 157. MRI brain repeated this am showed evolution of left ACA infarct with 2mm left to right midline  shift and 2 new punctate infarcts over bilateral temporal lobes.   Assessment/Plan: Acute recurrent CVA Evolution of recent left ACA infarct and 2 new infarct on MRI brain ( left temporal and rt parietal ) suggestive of embolic stroke.. Has 2 mm midline shift as well and  Encephalopathy worsened. Seen by swallow eval and recommended dys level 1 diet. Continue ASA suppository. C-started on Decadron for a midline shift . Will discontinue as she is made comfort care.  Stroke w/up done recently. Has por prognosis and cannot participate with PT given extent of stroke. palliative care consulted per family request. Wish to make her comfort care with home hospice. No IV line or blood draws. Provide comfort feeding.  Morphine concentrate soln q2 hr prn for SOB and comfort. Ativan prn for anxiety    Hypernatremia  Patient has significant hypernatremia. Possibly from dehydration.  Was admitted to [facility] with normal sodium levels. possobly from dehydration. Elevated serum osm. Urine Na normal. Follow urine osm. Placed on D5W and getting free without much improvement. Now taken off all fluids.   Fever and shortness of breath Patient afebrile here. resp status stable. Reportedly c/o abdomen pain in ED . abd x ray was unremarkable. Possibly has some bronchitis or early  pneumonia. Placed on empiric vanco and cefepime. cx  negative. D/c abx    Diabetes mellitus  Elevated fsg. D/c SSI and lantus  UTI  cx growing klebsiella. Treated with levaquin .        Code Status: DNR, comfort care Family Communication: None at bedside Disposition Plan: d/c home with  hospice tomorrow   Consultants:  neurology  Procedures:  MRI brian  Antibiotics:  none  HPI/Subjective: Patient remains aphasic. No clinical improvement Objective: Filed Vitals:   04/05/14 1000  BP: 121/56  Pulse: 109  Temp: 98 F (36.7 C)  Resp: 20    Intake/Output Summary (Last 24 hours) at 04/05/14 1151 Last data filed at 04/04/14 2000  Gross per 24 hour  Intake    360 ml  Output      0 ml  Net    360 ml   Filed Weights   04/02/14 0500 04/03/14 0518 04/05/14 0603  Weight: 118.7 kg (261 lb 11 oz) 119.4 kg (263 lb 3.7 oz) 118.117 kg (260 lb 6.4 oz)    Exam:   General:  Elderly female sitting up in bed , has flattened affect  HEENT: no pallor, moist oral mucosa, right  Facial droop  Chest: clear b/l, no added sounds  CVS: NS1&S2, no MRG  Abd: soft, NT, ND, BS+, foley in place Ext: warm, no edema CNS: AAOX0, right facial droop, 3/5 power over LUE,    Data Reviewed: Basic Metabolic Panel:  Recent Labs Lab 04/03/14 1900 04/04/14 0348 04/04/14 1148 04/04/14 1851 04/05/14 0420  NA 155* 154* 154* 152* 157*  K 5.4* 4.3 4.5 4.7  4.6  CL 121* 118* 117* 117* 120*  CO2 23 24 22 22 23   GLUCOSE 248* 182* 329* 382* 385*  BUN 30* 30* 32* 37* 46*  CREATININE 0.76 0.75 0.85 1.00 1.12*  CALCIUM 8.4 8.4 8.3* 7.9* 8.3*   Liver Function Tests: No results found for this basename: AST, ALT, ALKPHOS, BILITOT, PROT, ALBUMIN,  in the last 168 hours No results found for this basename: LIPASE, AMYLASE,  in the last 168 hours No results found for this basename: AMMONIA,  in the last 168 hours CBC:  Recent Labs Lab 03/30/14 0618 03/31/14 0556 03/31/14 1809  WBC  16.0* 11.0* 11.4*  NEUTROABS  --   --  8.1*  HGB 12.2 12.7 13.0  HCT 36.7 39.5 39.1  MCV 89.1 90.0 89.7  PLT 213 216 211   Cardiac Enzymes: No results found for this basename: CKTOTAL, CKMB, CKMBINDEX, TROPONINI,  in the last 168 hours BNP (last 3 results)  Recent Labs  03/26/14 2049  PROBNP 231.2   CBG:  Recent Labs Lab 04/04/14 1147 04/04/14 1604 04/04/14 2000 04/04/14 2359 04/05/14 0434  GLUCAP 318* 231* 356* 352* 321*    Recent Results (from the past 240 hour(s))  URINE CULTURE     Status: None   Collection Time    03/26/14  9:57 PM      Result Value Ref Range Status   Specimen Description URINE, CATHETERIZED   Final   Special Requests NONE   Final   Culture  Setup Time     Final   Value: 03/27/2014 02:30     Performed at [facility] Count     Final   Value: >=100,000 COLONIES/ML     Performed at [facility]   Culture     Final   Value: KLEBSIELLA PNEUMONIAE     Performed at [facility]   Report Status 03/28/2014 FINAL   Final   Organism ID, Bacteria KLEBSIELLA PNEUMONIAE   Final  CLOSTRIDIUM DIFFICILE BY PCR     Status: None   Collection Time    03/26/14 10:10 PM      Result Value Ref Range Status   C difficile by pcr NEGATIVE  NEGATIVE Final  CULTURE, BLOOD (ROUTINE X 2)     Status: None   Collection Time    03/27/14  9:57 AM      Result Value Ref Range Status   Specimen Description BLOOD RIGHT ANTECUBITAL   Final   Special Requests     Final   Value: BOTTLES DRAWN AEROBIC AND ANAEROBIC AEB=9CC ANA=11CC   Culture NO GROWTH 5 DAYS   Final   Report Status 04/01/2014 FINAL   Final  CULTURE, BLOOD (ROUTINE X 2)     Status: None   Collection Time    03/27/14 10:03 AM      Result Value Ref Range Status   Specimen Description BLOOD LEFT HAND   Final   Special Requests BOTTLES DRAWN AEROBIC AND ANAEROBIC 10CC   Final   Culture NO GROWTH 5 DAYS   Final   Report Status 04/01/2014 FINAL   Final  CULTURE, BLOOD (ROUTINE  X 2)     Status: None   Collection Time    03/31/14 12:37 AM      Result Value Ref Range Status   Specimen Description BLOOD RIGHT ARM   Final   Special Requests BOTTLES DRAWN AEROBIC AND ANAEROBIC 10CC   Final   Culture  Setup Time  Final   Value: 04/01/2014 03:38     Performed at [facility]   Culture     Final   Value:        BLOOD CULTURE RECEIVED NO GROWTH TO DATE CULTURE WILL BE HELD FOR 5 DAYS BEFORE ISSUING A FINAL NEGATIVE REPORT     Performed at [facility]   Report Status PENDING   Incomplete  CULTURE, BLOOD (ROUTINE X 2)     Status: None   Collection Time    03/31/14 12:45 AM      Result Value Ref Range Status   Specimen Description BLOOD RIGHT HAND   Final   Special Requests BOTTLES DRAWN AEROBIC ONLY 1CC   Final   Culture  Setup Time     Final   Value: 04/01/2014 03:38     Performed at [facility]   Culture     Final   Value:        BLOOD CULTURE RECEIVED NO GROWTH TO DATE CULTURE WILL BE HELD FOR 5 DAYS BEFORE ISSUING A FINAL NEGATIVE REPORT     Performed at [facility]   Report Status PENDING   Incomplete  URINE CULTURE     Status: None   Collection Time    03/31/14  9:39 PM      Result Value Ref Range Status   Specimen Description URINE, CATHETERIZED   Final   Special Requests NONE   Final   Culture  Setup Time     Final   Value: 03/31/2014 23:51     Performed at [facility]   Colony Count     Final   Value: NO GROWTH     Performed at [facility]   Culture     Final   Value: NO GROWTH     Performed at [facility]   Report Status 04/02/2014 FINAL   Final  CLOSTRIDIUM DIFFICILE BY PCR     Status: None   Collection Time    04/01/14  5:22 AM      Result Value Ref Range Status   C difficile by pcr NEGATIVE  NEGATIVE Final     Studies: Dg Chest Port 1 View  04/03/2014   CLINICAL DATA:  Fever.  Rule out aspiration.  EXAM: PORTABLE CHEST - 1 VIEW  COMPARISON:  03/31/2014  FINDINGS: Lung  volumes are low. There is minor basilar atelectasis, stable. Lungs are otherwise clear. Cardiac silhouette is normal in size. No mediastinal or hilar masses. No evidence of adenopathy.  Bony thorax is demineralized but grossly intact.  IMPRESSION: No acute cardiopulmonary disease.   Electronically Signed   By: Amie Portlandavid  Ormond M.D.   On: 04/03/2014 17:05    Scheduled Meds: . aspirin  300 mg Rectal Daily  . dexamethasone  2 mg Intravenous Q12H  . ipratropium-albuterol  3 mL Nebulization TID  . LORazepam  1 mg Oral BID  . magic mouthwash  10 mL Oral QID  . morphine CONCENTRATE  5 mg Oral Q12H  . pantoprazole  40 mg Oral Daily  . scopolamine  1 patch Transdermal Q72H   Continuous Infusions:      Time spent: 35 minutes    Alyzabeth Pontillo  [facility] Pager [phone]. If 7PM-7AM, please contact night-coverage at [email or web address], password [email or web address] 04/05/2014, 11:51 AM  LOS: 5 days

## 2014-04-06 LAB — GLUCOSE, CAPILLARY
GLUCOSE-CAPILLARY: 265 mg/dL — AB (ref 70–99)
Glucose-Capillary: 392 mg/dL — ABNORMAL HIGH (ref 70–99)
Glucose-Capillary: 440 mg/dL — ABNORMAL HIGH (ref 70–99)

## 2014-04-06 MED ORDER — ASPIRIN EC 325 MG PO TBEC
325.0000 mg | DELAYED_RELEASE_TABLET | Freq: Every day | ORAL | Status: AC
Start: 2014-04-06 — End: ?

## 2014-04-06 MED ORDER — MAGIC MOUTHWASH: 10.0000 mL | Freq: Four times a day (QID) | ORAL | Status: AC

## 2014-04-06 MED ORDER — SCOPOLAMINE 1 MG/3DAYS TD PT72: 1.0000 | MEDICATED_PATCH | TRANSDERMAL | Status: AC

## 2014-04-06 MED ORDER — INSULIN ASPART 100 UNIT/ML ~~LOC~~ SOLN
15.0000 [IU] | Freq: Once | SUBCUTANEOUS | Status: AC
  Administered 2014-04-06: 15 [IU] via SUBCUTANEOUS

## 2014-04-06 MED ORDER — MORPHINE SULFATE (CONCENTRATE) 10 MG /0.5 ML PO SOLN: 10.0000 mg | ORAL | Status: AC | PRN

## 2014-04-06 MED ORDER — IPRATROPIUM-ALBUTEROL 0.5-2.5 (3) MG/3ML IN SOLN: 3.0000 mL | Freq: Four times a day (QID) | RESPIRATORY_TRACT | Status: DC | PRN

## 2014-04-06 MED ORDER — STARCH (THICKENING) PO POWD: 1.0000 g | ORAL | Status: AC | PRN

## 2014-04-06 MED ORDER — LORAZEPAM 2 MG/ML PO CONC: 1.0000 mg | ORAL | Status: AC | PRN

## 2014-04-06 NOTE — Clinical Social Work Note (Signed)
Clinical Social Worker spoke with patient son at bedside who is agreeable with patient transfer home with Hospice.  Patient son states that all equipment has been delivered to the home and patient granddaughter is ready for patient at home.  CSW arranged ambulance transport via [facility] [place] [address] [place], [place] with confirmation of address from patient son.    Clinical Social Worker will sign off for now as social work intervention is no longer needed. Please consult us again if new need arises.  Diane GoldsJesse Saurav Rangel, [place] [phone]

## 2014-04-06 NOTE — Discharge Summary (Signed)
Physician Discharge Summary  Diane Rangel EXN:[id number] DOB: 05-Sep-1934 DOA: 03/31/2014  PCP: Diane Conrad, MD  Admit date: 03/31/2014 Discharge date: 04/06/2014  Time spent: 40 minutes  Recommendations for Outpatient Follow-up:  1. Discharge Home with home hospice  Discharge Diagnoses:  Principal Problem:   Acute embolic stroke    Active Problems: Acute bronchiolitis   DIABETES MELLITUS, TYPE II, UNCONTROLLED   HYPERTENSION   GERD   Acute renal failure   Acute encephalopathy   Hypernatremia   H/O ischemic left ACA stroke   Discharge Condition: guarded  Diet recommendation: comfort feeding  Code status: DNR, comfort care  Filed Weights   04/02/14 0500 04/03/14 0518 04/05/14 0603  Weight: 118.7 kg (261 lb 11 oz) 119.4 kg (263 lb 3.7 oz) 118.117 kg (260 lb 6.4 oz)    History of present illness:  80 y/o female with recent left ACA stroke with residual right hemiplegia and mental status changes. She was discharged from [facility] to [facility] only yesterday. Prior to discharge she had diarrhea and hypernatremia and required rectal tube placement. Barely after arriving to SNF she developed fever and increasing lethargy and was sent to [facility]. Head CT showed evolution of left ACA stoke. Her hypernatremia worsened to 157. MRI brain repeated this am showed evolution of left ACA infarct with 2mm left to right midline shift and 2 new punctate infarcts over bilateral temporal lobes.   Hospital Course:  Acute recurrent CVA  Evolution of recent left ACA infarct and 2 new infarct on MRI brain ( left temporal and rt parietal ) suggestive of embolic stroke.. Has 2 mm midline shift as well and Encephalopathy worsened.  Seen by swallow eval and recommended dys level 1 diet.  Continue ASA suppository. C-started on Decadron for a midline shift . Will discontinue as she is made comfort care.  Stroke w/up done recently. Has por prognosis and cannot participate with PT given extent of stroke.  palliative  care consulted per family request. Wish to make her comfort care with home hospice. No IV line or blood draws. Provide comfort feeding.  roxanol concentrate  solution 1 ml  soln q2 hr prn for SOB and comfort.  Ativan prn for anxiety  Arranged for  home supplies.  patient will be discharged home with home hospice provided by [facility].  Hypernatremia  Patient has significant hypernatremia. Possibly from dehydration. Was admitted to [facility] with normal sodium levels. possobly from dehydration. Elevated serum osm. Urine Na normal. Follow urine osm. Placed on D5W and getting free without much improvement. Now taken off all fluids.   Fever and shortness of breath  Patient afebrile here. resp status stable. Reportedly c/o abdomen pain in ED . abd x ray was unremarkable. Possibly has some bronchitis or early pneumonia. Placed on empiric vanco and cefepime. cx negative. Off abx   Diabetes mellitus  Elevated fsg. Off insulin  UTI  Secondary to  klebsiella. Treated with levaquin .    Code Status: DNR, comfort care  Family Communication: None at bedside  Disposition Plan: d/c home with hospice tomorrow  Consultants:  neurology Procedures:  MRI brian Antibiotics:  none     Discharge Exam: Filed Vitals:   04/06/14 0514  BP: 133/74  Pulse: 114  Temp: 100.2 F (37.9 C)  Resp: 20     General: Elderly female sitting up in bed , has flattened affect  HEENT: no pallor, moist oral mucosa, right Facial droop  Chest: clear b/l, no added sounds  CVS: NS1&S2,  no MRG  Abd: soft, NT, ND, BS+, foley in place Ext: warm, no edema  CNS: AAOX0, right facial droop, 3/5 power over LUE  Discharge Instructions You were cared for by a hospitalist during your hospital stay. If you have any questions about your discharge medications or the care you received while you were in the hospital after you are discharged, you can call the unit and asked to speak with the hospitalist on call if the  hospitalist that took care of you is not available. Once you are discharged, your primary care physician will handle any further medical issues. Please note that NO REFILLS for any discharge medications will be authorized once you are discharged, as it is imperative that you return to your primary care physician (or establish a relationship with a primary care physician if you do not have one) for your aftercare needs so that they can reassess your need for medications and monitor your lab values.     Medication List    STOP taking these medications       amLODipine 10 MG tablet  Commonly known as:  NORVASC     l-methylfolate-B6-B12 3-35-2 MG Tabs  Commonly known as:  METANX     levofloxacin 750 MG tablet  Commonly known as:  LEVAQUIN     NOVOLIN N RELION 100 UNIT/ML injection  Generic drug:  insulin NPH Human     ranitidine 150 MG tablet  Commonly known as:  ZANTAC      TAKE these medications       aspirin EC 325 MG tablet  Take 1 tablet (325 mg total) by mouth daily.     food thickener Powd  Commonly known as:  THICK IT  Take 1 g by mouth as needed (on honey thick liqid diet.).     LORazepam 2 MG/ML concentrated solution  Commonly known as:  ATIVAN  Take 0.5 mLs (1 mg total) by mouth every 4 (four) hours as needed for anxiety or sleep.     magic mouthwash Soln  Take 10 mLs by mouth 4 (four) times daily.     morphine CONCENTRATE 10 mg / 0.5 ml concentrated solution  Take 0.5 mLs (10 mg total) by mouth every 2 (two) hours as needed for moderate pain or shortness of breath.     polyethylene glycol powder powder  Commonly known as:  GLYCOLAX/MIRALAX  Take 17 g by mouth daily.     scopolamine 1 MG/3DAYS  Commonly known as:  TRANSDERM-SCOP  Place 1 patch (1.5 mg total) onto the skin every 3 (three) days.       No Known Allergies     Follow-up Information   Please follow up. (home hospice)        The results of significant diagnostics from this hospitalization  (including imaging, microbiology, ancillary and laboratory) are listed below for reference.    Significant Diagnostic Studies: Ct Abdomen Pelvis Wo Contrast  03/27/2014   CLINICAL DATA:  Persistent diarrhea/abdominal pain  EXAM: CT ABDOMEN AND PELVIS WITHOUT CONTRAST  TECHNIQUE: Multidetector CT imaging of the abdomen and pelvis was performed following the standard protocol without intravenous contrast.  COMPARISON:  None.  FINDINGS: Increased density is appreciated within the lung bases right greater than left.  A trace amount of perihepatic ascites is appreciated. The liver is otherwise unremarkable. The gallbladder and gallbladder fossa are unremarkable.  Noncontrast evaluation of the spleen, adrenals, kidneys is unremarkable. There is near complete fatty replacement of the pancreas visualized portions unremarkable.  Within the limitations of a noncontrast CT there is no evidence of enteritis, diverticulitis nor appendicitis. There is no evidence bowel obstruction. No abdominal or pelvic free fluid, loculated fluid collections, masses nor adenopathy. There is no evidence of abdominal aortic aneurysm.  Within the rectosigmoid colon, there are areas of bowel wall prominence representing either bowel wall edema versus partial decompression. Mild inflammatory change is also appreciated within the surrounding intraperitoneal fat. There is no evidence of free-fluid nor loculated fluid collections. There is no evidence of free air.  A fat containing anterior abdominal wall hernia is appreciated measuring 8 cm in diameter. There is no evidence of an inguinal hernia.  Severe osteoarthritic changes are appreciated within the left hip. Multilevel spondylosis is appreciated within the spine. There is no aggressive appearing osseous lesions.  IMPRESSION: 1. Findings which may reflect early or mild proctitis. No associated drainable loculated fluid collections are appreciated. 2. Atelectasis versus infiltrates within the  lung bases right greater than left. 3. Very small amount of perihepatic ascites likely reactive 4. Fact containing anterior abdominal wall hernia 5. Degenerative changes within the spine, and left hip.   Electronically Signed   By: Salome Holmes M.D.   On: 03/27/2014 15:30   Ct Head Wo Contrast  03/31/2014   CLINICAL DATA:  Altered mental status, recent stroke.  EXAM: CT HEAD WITHOUT CONTRAST  TECHNIQUE: Contiguous axial images were obtained from the base of the skull through the vertex without intravenous contrast.  COMPARISON:  MRI brain dated 03/27/2014.  CT head dated 03/26/2014.  FINDINGS: Evolving left ACA distribution infarct, predominantly involving the left medial frontal lobe.  No evidence of parenchymal hemorrhage or extra-axial fluid collection.  No mass lesion, mass effect, or midline shift.  Subcortical white matter and periventricular small vessel ischemic changes. Intracranial atherosclerosis.  Cerebral volume is within normal limits.  No ventriculomegaly.  The visualized paranasal sinuses are essentially clear. The mastoid air cells are unopacified.  No evidence of calvarial fracture.  IMPRESSION: Evolving left ACA distribution infarct.   Electronically Signed   By: Charline Bills M.D.   On: 03/31/2014 20:04   Ct Head Wo Contrast  03/26/2014   CLINICAL DATA:  Drug overdose.  Altered mental status.  EXAM: CT HEAD WITHOUT CONTRAST  TECHNIQUE: Contiguous axial images were obtained from the base of the skull through the vertex without intravenous contrast.  COMPARISON:  CT HEAD W/O CM dated 06/24/2004  FINDINGS: Chronic ischemic changes in the periventricular white matter and left caudate head. No mass effect, midline shift, or acute intracranial hemorrhage. Mastoid air cells are clear.  IMPRESSION: No acute intracranial pathology.   Electronically Signed   By: Maryclare Bean M.D.   On: 03/26/2014 23:45   Mr Maxine Glenn Head Wo Contrast  03/27/2014   CLINICAL DATA:  Diabetes and hypertension.  Altered  mental status.  EXAM: MRI HEAD WITHOUT CONTRAST  MRA HEAD WITHOUT CONTRAST  TECHNIQUE: Multiplanar, multiecho pulse sequences of the brain and surrounding structures were obtained without intravenous contrast. Angiographic images of the head were obtained using MRA technique without contrast.  COMPARISON:  03/26/2014 CT.  No comparison MR.  FINDINGS: Focal web-like stenosis right middle cerebral artery bifurcation.  Moderate to marked narrowing middle cerebral artery branches bilaterally.  Occluded right vertebral artery. Minimal retrograde flow distal right vertebral artery.  Non visualized posterior inferior cerebellar arteries.  Moderate narrowing anterior inferior cerebellar arteries.  Nonvisualized right superior cerebellar artery with moderate to marked narrowing left superior cerebral artery.  Moderate  to marked web-like stenosis basilar tip.  Moderate to marked narrowing posterior cerebral artery mid to distal aspect bilaterally.  No aneurysm noted  IMPRESSION: MRI HEAD:  Acute moderate to large size medial left frontal lobe nonhemorrhagic infarct extending into the parietal lobe in a left anterior cerebral artery distribution.  Remote small basal ganglia and thalamic infarct some of which were partially hemorrhagic.  Small vessel disease type changes.  MRA HEAD:  Prominent intracranial atherosclerotic type changes as detailed above. This includes  abrupt cut off of flow proximal A2 segment left anterior cerebral artery consistent with patient's acute infarct.   Electronically Signed   By: Bridgett LarssonSteve  Olson M.D.   On: 03/27/2014 13:56   Mr Brain Wo Contrast  04/01/2014   CLINICAL DATA:  Fever.  Stroke.  EXAM: MRI HEAD WITHOUT CONTRAST  TECHNIQUE: Multiplanar, multiecho pulse sequences of the brain and surrounding structures were obtained without intravenous contrast.  COMPARISON:  Head CT 03/31/2014.  MRI 03/27/2014.  FINDINGS: There continues true restricted diffusion throughout the left anterior cerebral  artery territory. The area of involvement shows mild swelling. There is mild mass effect with left-to-right shift of 2 mm. Petechial blood products are present within the region of infarction, without focal hematoma formation. I think there is been slight extension of the infarction into the splenium of the corpus callosum. Involvement of the body of the corpus callosum is unchanged. There is in a punctate acute infarction seen within the left temporal lobe posteriorly. There is a punctate acute infarction now evident in the right parietal lobe. These suggest new micro embolic infarctions. No hydrocephalus. No extra-axial collection. No other new finding.  IMPRESSION: Evolutionary changes of the previously seen left anterior cerebral artery territory infarction. Petechial blood products within the region of infarction without frank hematoma. Swelling with left-to-right midline shift of 2 mm. Some extension of the infarction into the splenium of the corpus callosum.  Two new punctate acute infarctions, 1 within the left posterior temporal lobe in the other within the right parietal lobe. New development of these infarctions raises the possibility of micro embolic disease.   Electronically Signed   By: Paulina FusiMark  Shogry M.D.   On: 04/01/2014 11:15   Mr Brain Wo Contrast  03/27/2014   CLINICAL DATA:  Diabetes and hypertension.  Altered mental status.  EXAM: MRI HEAD WITHOUT CONTRAST  MRA HEAD WITHOUT CONTRAST  TECHNIQUE: Multiplanar, multiecho pulse sequences of the brain and surrounding structures were obtained without intravenous contrast. Angiographic images of the head were obtained using MRA technique without contrast.  COMPARISON:  03/26/2014 CT.  No comparison MR.  FINDINGS: Focal web-like stenosis right middle cerebral artery bifurcation.  Moderate to marked narrowing middle cerebral artery branches bilaterally.  Occluded right vertebral artery. Minimal retrograde flow distal right vertebral artery.  Non  visualized posterior inferior cerebellar arteries.  Moderate narrowing anterior inferior cerebellar arteries.  Nonvisualized right superior cerebellar artery with moderate to marked narrowing left superior cerebral artery.  Moderate to marked web-like stenosis basilar tip.  Moderate to marked narrowing posterior cerebral artery mid to distal aspect bilaterally.  No aneurysm noted  IMPRESSION: MRI HEAD:  Acute moderate to large size medial left frontal lobe nonhemorrhagic infarct extending into the parietal lobe in a left anterior cerebral artery distribution.  Remote small basal ganglia and thalamic infarct some of which were partially hemorrhagic.  Small vessel disease type changes.  MRA HEAD:  Prominent intracranial atherosclerotic type changes as detailed above. This includes  abrupt cut off of  flow proximal A2 segment left anterior cerebral artery consistent with patient's acute infarct.   Electronically Signed   By: Bridgett Larsson M.D.   On: 03/27/2014 13:56   US Renal  03/28/2014   CLINICAL DATA:  Worsening renal function, hypertension, diabetes  EXAM: RENAL/URINARY TRACT ULTRASOUND COMPLETE  COMPARISON:  CT abdomen and pelvis 03/27/2014  TECHNIQUE: Sonography of the kidneys and urinary bladder performed. Exam quality degraded secondary to body habitus.  FINDINGS: Right Kidney:  Length: 8.9 cm. Limited visualization. Lower pole partially obscured. Grossly normal cortical echogenicity. Mild cortical thinning. No gross mass or hydronephrosis.  Left Kidney:  Length: 9.1 cm. Limited visualization unable to adequately visualized to exclude mass or obstruction.  Bladder:  Decompressed by Foley catheter, unable to evaluate  IMPRESSION: Extremely limited exam secondary to body habitus.  Grossly unremarkable right kidney.  Inadequate assessment of left kidney an urinary bladder.   Electronically Signed   By: Ulyses Southward M.D.   On: 03/28/2014 14:38   US Carotid Bilateral  03/27/2014   CLINICAL DATA:  Stroke,  hypertension, altered mental status  EXAM: BILATERAL CAROTID DUPLEX ULTRASOUND  TECHNIQUE: Wallace Cullens scale imaging, color Doppler and duplex ultrasound was performed of bilateral carotid and vertebral arteries in the neck.  COMPARISON:  None.  REVIEW OF SYSTEMS: Quantification of carotid stenosis is based on velocity parameters that correlate the residual internal carotid diameter with NASCET-based stenosis levels, using the diameter of the distal internal carotid lumen as the denominator for stenosis measurement.  The following velocity measurements were obtained:  PEAK SYSTOLIC/END DIASTOLIC  RIGHT  ICA:                     85/16cm/sec  CCA:                     75/7cm/sec  SYSTOLIC ICA/CCA RATIO:  1.13  DIASTOLIC ICA/CCA RATIO: 2.26  ECA:                     110cm/sec  LEFT  ICA:                     83/10cm/sec  CCA:                     77/10cm/sec  SYSTOLIC ICA/CCA RATIO:  1.08  DIASTOLIC ICA/CCA RATIO: 1.0  ECA:                     86cm/sec  FINDINGS: RIGHT CAROTID ARTERY: Intimal thickening through the common carotid artery. There circumferential plaque in the carotid bulb. There is eccentric moderate partially calcified plaque in the proximal ICA resulting in at least mild stenosis. Normal waveforms and peak systolic velocities however. Distal ICA mildly tortuous.  RIGHT VERTEBRAL ARTERY:  Normal flow direction and waveform.  LEFT CAROTID ARTERY: Intimal thickening through the mildly tortuous common carotid artery. There is noncalcified plaque in the carotid bulb extending into the proximal ICA without high-grade stenosis. Normal waveforms and color Doppler signal. Distal ICA is tortuous.  LEFT VERTEBRAL ARTERY: Normal flow direction and waveform.  IMPRESSION: 1. Bilateral carotid bifurcation and proximal ICA plaque, resulting in less than 50% diameter stenosis. The exam does not exclude plaque ulceration or embolization. Continued surveillance recommended.   Electronically Signed   By: Oley Balm M.D.   On:  03/27/2014 16:53   Dg Chest Port 1 View  04/03/2014   CLINICAL DATA:  Fever.  Rule  out aspiration.  EXAM: PORTABLE CHEST - 1 VIEW  COMPARISON:  03/31/2014  FINDINGS: Lung volumes are low. There is minor basilar atelectasis, stable. Lungs are otherwise clear. Cardiac silhouette is normal in size. No mediastinal or hilar masses. No evidence of adenopathy.  Bony thorax is demineralized but grossly intact.  IMPRESSION: No acute cardiopulmonary disease.   Electronically Signed   By: Amie Portland M.D.   On: 04/03/2014 17:05   Dg Chest Portable 1 View  03/31/2014   CLINICAL DATA:  Fever common weakness  EXAM: PORTABLE CHEST - 1 VIEW  COMPARISON:  Prior chest x-ray 03/29/2014  FINDINGS: Very low inspiratory volumes with stable bibasilar opacities likely atelectasis or scarring. Unchanged borderline cardiomegaly. Atherosclerotic calcifications again noted in the transverse aorta. No new effusion common pneumothorax, pulmonary edema or focal airspace consolidation. No acute osseous abnormality.  IMPRESSION: No significant interval change in the appearance of the chest. Persistent very low inspiratory volumes with bibasilar atelectasis.   Electronically Signed   By: Malachy Moan M.D.   On: 03/31/2014 18:38   Dg Chest Port 1 View  03/29/2014   CLINICAL DATA:  Question aspiration pneumonia.  EXAM: PORTABLE CHEST - 1 VIEW  COMPARISON:  03/26/2014 and 12/19/2012.  FINDINGS: 0943 hr. There are persistent low lung volumes with slightly increased dependent opacities at both lung bases, likely atelectasis. Early aspiration cannot be excluded. Vascular congestion and cardiomegaly are stable. There is no significant pleural effusion. Telemetry leads overlie the chest.  IMPRESSION: Slightly increased bibasilar opacities likely represent atelectasis given the low lung volumes. However, early aspiration cannot be excluded.   Electronically Signed   By: Roxy Horseman M.D.   On: 03/29/2014 11:14   Dg Chest Port 1  View  03/26/2014   CLINICAL DATA:  Shortness of Breath  EXAM: PORTABLE CHEST - 1 VIEW  COMPARISON:  December 19, 2012  FINDINGS: There is no edema or consolidation. Heart is mildly enlarged with normal pulmonary vascularity. No adenopathy. There is degenerative change in the thoracic spine.  IMPRESSION: Mild cardiac enlargement.  No edema or consolidation.   Electronically Signed   By: Bretta Bang M.D.   On: 03/26/2014 21:18   Dg Abd 2 Views  03/26/2014   CLINICAL DATA:  Diarrhea  EXAM: ABDOMEN - 2 VIEW  COMPARISON:  None.  FINDINGS: Study is limited due to body habitus. Gaseous distention of colon and possibly small bowel is present. Left pelvic phlebolith. No obvious free intraperitoneal gas.  IMPRESSION: Limited exam.  Distended loops of colon are nonspecific.   Electronically Signed   By: Maryclare Bean M.D.   On: 03/26/2014 23:29   Dg Abd Portable 1v  03/31/2014   CLINICAL DATA:  Abdominal pain  EXAM: PORTABLE ABDOMEN - 1 VIEW  COMPARISON:  Portable exam 2301 hr compared to 03/26/2014  FINDINGS: Gaseous distention of colon.  Retained contrast for stool in right colon.  No definite bowel wall thickening.  Additional air-filled loops of bowel in the a left lower quadrant are nonspecific, could represent mildly dilated small bowel or sigmoid colon.  No acute osseous findings or definite urinary tract calcification.  IMPRESSION: Air-filled loops of bowel throughout abdomen greater on left, majority colonic though a few bowel loops in the left lower quadrant are indeterminate for sigmoid colon versus small bowel.  No definite bowel wall thickening.  If patient has persistent symptoms, consider CT imaging to exclude small bowel dilatation.   Electronically Signed   By: Angelyn Punt.D.  On: 03/31/2014 23:41   Dg Swallowing Func-speech Pathology  03/30/2014   Dorene Ar, CCC-SLP     03/30/2014  3:09 PM Objective Swallowing Evaluation: Modified Barium Swallowing Study    Patient Details  Name: Diane Rangel MRN: [id number] Date of Birth: 1934-10-14  Today's Date: 03/30/2014 Time: 4098-1191 SLP Time Calculation (min): 31 min  Past Medical History:  Past Medical History  Diagnosis Date  . Hypertension   . Diabetes mellitus without complication   . CVA (cerebral infarction)   . UTI (lower urinary tract infection)     klebsiella   Past Surgical History:  Past Surgical History  Procedure Laterality Date  . Ankle surgery     HPI:  80 year old female with a history of diabetes mellitus,  hypertension, and irritable bowel syndrome presented with mental  status change and diarrhea.  The patient was in her usual state  of health up until 6 PM on 03/26/2014. The patient had an acute  onset of diarrhea without hematochezia or melena. The patient  became weak after her for loose bowel movement, and her family  later today around 6:30 PM. When the family came back  approximately 10-15 minutes later, the patient was minimally  responsive and not speaking. As a result, EMS was activated. In  the ED, the patient had an episode of emesis, but the patient's  son stated that she was able to communicate. The patient lives  with her granddaughter and everyone else in the household is  without any diarrheal illness. On the morning of 03/27/2014, the  patient became more encephalopathic. 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     Assessment / Plan / Recommendation Clinical Impression  Dysphagia Diagnosis: Mild oral phase dysphagia;Mild pharyngeal  phase dysphagia Clinical impression: Pt presents with mild oropharyngeal  dysphagia in setting of acute CVA and lethargy characterized by  weak lingual manipulation, mild delay in oral transit, piecemeal  deglutition, delay in swallow initiation with nectars (fills  valleculae) and thins (spills to pyriforms) resulting in lingual  residue with semi-solids and flash  penetration of nectars  (variable) and thins (consistent). No aspiration was observed. Pt  had loosed coughing episodes, but no barium noted in laryngeal  vestibule/airway. Recommend initiating D1/puree with nectar thick  liquids due to pt lethargy and lack of volitional cough.  Prognosis for upgraded textures is good with improved level of  alertness. Pt will need assist for all po intake.     Treatment Recommendation  Therapy as outlined in treatment plan below    Diet Recommendation Dysphagia 1 (Puree);Nectar-thick liquid   Liquid Administration via: Cup;Spoon Medication Administration: Crushed with puree Supervision: Full supervision/cueing for compensatory  strategies;Staff to assist with self feeding Compensations: Slow rate;Small sips/bites;Check for pocketing Postural Changes and/or Swallow Maneuvers: Out of bed for  meals;Seated upright 90 degrees;Upright 30-60 min after meal    Other  Recommendations Oral Care Recommendations: Oral care  BID;Staff/trained caregiver to provide oral care Other Recommendations: Order thickener from pharmacy;Clarify  dietary restrictions   Follow Up Recommendations  Skilled Nursing facility    Frequency and Duration min 2x/week  2 weeks       SLP Swallow Goals  Pt will demonstrate safe and efficient consumption of D1/puree  and nectar-thick liquids with mod assist.   General Date of Onset: 03/26/14 HPI: 80 year old female with a history of diabetes mellitus,  hypertension, and irritable bowel syndrome presented with mental  status change and diarrhea.  The patient was in her usual state  of health up until 6 PM on 03/26/2014. The patient had an acute  onset of diarrhea without hematochezia or melena. The patient  became weak after her for loose bowel movement, and her family  later today around 6:30 PM. When the family came back  approximately 10-15 minutes later, the patient was minimally  responsive and not speaking. As a result, EMS was activated. In  the ED, the patient  had an episode of emesis, but the patient's  son stated that she was able to communicate. The patient lives  with her granddaughter and everyone else in the household is  without any diarrheal illness. On the morning of 03/27/2014, the  patient became more encephalopathic. 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 Type of Study: Modified Barium Swallowing Study Reason for Referral: Objectively evaluate swallowing function Previous Swallow Assessment: BSE: NPO Diet Prior to this Study: NPO Temperature Spikes Noted: No Respiratory Status: Nasal cannula History of Recent Intubation: No Behavior/Cognition: Lethargic;Pleasant mood;Cooperative;Requires  cueing Oral Cavity - Dentition: Poor condition;Missing dentition Oral Motor / Sensory Function: Impaired - see Bedside swallow  eval Self-Feeding Abilities: Needs assist Patient Positioning: Upright in chair Baseline Vocal Quality: Aphonic Volitional Cough: Cognitively unable to elicit Volitional Swallow: Unable to elicit Anatomy: Within functional limits Pharyngeal Secretions: Not observed secondary MBS    Reason for Referral Objectively evaluate swallowing function   Oral Phase Oral Preparation/Oral Phase Oral Phase: Impaired Oral - Solids Oral - Puree: Lingual/palatal residue Oral - Mechanical Soft: Weak lingual manipulation;Lingual/palatal  residue;Piecemeal swallowing;Delayed oral transit   Pharyngeal Phase Pharyngeal Phase Pharyngeal Phase: Impaired Pharyngeal - Nectar Pharyngeal - Nectar Teaspoon: Delayed swallow  initiation;Premature spillage to valleculae Pharyngeal - Nectar Cup: Delayed swallow initiation;Premature  spillage to valleculae;Penetration/Aspiration during swallow Penetration/Aspiration details (nectar cup): Material does not  enter airway;Material enters airway, remains ABOVE vocal cords  then ejected out Pharyngeal - Nectar  Straw: Delayed swallow initiation;Premature  spillage to valleculae;Premature spillage to pyriform  sinuses;Penetration/Aspiration during swallow Penetration/Aspiration details (nectar straw): Material does not  enter airway;Material enters airway, remains ABOVE vocal cords  then ejected out Pharyngeal - Thin Pharyngeal - Thin Teaspoon: Delayed swallow initiation;Premature  spillage to pyriform sinuses;Premature spillage to  valleculae;Penetration/Aspiration during swallow Penetration/Aspiration details (thin teaspoon): Material does not  enter airway;Material enters airway, remains ABOVE vocal cords  then ejected out Pharyngeal - Thin Cup: Premature spillage to valleculae;Delayed  swallow initiation;Premature spillage to pyriform  sinuses;Penetration/Aspiration during swallow Penetration/Aspiration details (thin cup): Material does not  enter airway;Material enters airway, remains ABOVE vocal cords  then ejected out Pharyngeal - Solids Pharyngeal - Puree: Within functional limits Pharyngeal - Mechanical Soft: Within functional limits Pharyngeal - Pill: Not tested  Cervical Esophageal Phase    GO    Cervical Esophageal Phase Cervical Esophageal Phase:  (unable to adequately visualize due  to increased body habitus)        Thank you,  Havery Moros, CCC-SLP [phone]  Dorene Ar 03/30/2014, 3:08 PM     Microbiology: Recent Results (from the past 240 hour(s))  CULTURE, BLOOD (ROUTINE X 2)     Status: None   Collection Time    03/31/14 12:37 AM      Result Value Ref Range Status   Specimen Description BLOOD RIGHT ARM  Final   Special Requests BOTTLES DRAWN AEROBIC AND ANAEROBIC 10CC   Final   Culture  Setup Time     Final   Value: 04/01/2014 03:38     [facility]   Culture     Final   Value:        BLOOD CULTURE RECEIVED NO GROWTH TO DATE CULTURE WILL BE HELD FOR 5 DAYS BEFORE ISSUING A FINAL NEGATIVE REPORT     Performed at [facility]   Report Status PENDING    Incomplete  CULTURE, BLOOD (ROUTINE X 2)     Status: None   Collection Time    03/31/14 12:45 AM      Result Value Ref Range Status   Specimen Description BLOOD RIGHT HAND   Final   Special Requests BOTTLES DRAWN AEROBIC ONLY 1CC   Final   Culture  Setup Time     Final   Value: 04/01/2014 03:38     Performed at [facility]   Culture     Final   Value:        BLOOD CULTURE RECEIVED NO GROWTH TO DATE CULTURE WILL BE HELD FOR 5 DAYS BEFORE ISSUING A FINAL NEGATIVE REPORT     Performed at [facility]   Report Status PENDING   Incomplete  URINE CULTURE     Status: None   Collection Time    03/31/14  9:39 PM      Result Value Ref Range Status   Specimen Description URINE, CATHETERIZED   Final   Special Requests NONE   Final   Culture  Setup Time     Final   Value: 03/31/2014 23:51     Performed at [facility]   Colony Count     Final   Value: NO GROWTH     Performed at [facility]   Culture     Final   Value: NO GROWTH     Performed at [facility]   Report Status 04/02/2014 FINAL   Final  CLOSTRIDIUM DIFFICILE BY PCR     Status: None   Collection Time    04/01/14  5:22 AM      Result Value Ref Range Status   C difficile by pcr NEGATIVE  NEGATIVE Final     Labs: Basic Metabolic Panel:  Recent Labs Lab 04/03/14 1900 04/04/14 0348 04/04/14 1148 04/04/14 1851 04/05/14 0420  NA 155* 154* 154* 152* 157*  K 5.4* 4.3 4.5 4.7 4.6  CL 121* 118* 117* 117* 120*  CO2 23 24 22 22 23   GLUCOSE 248* 182* 329* 382* 385*  BUN 30* 30* 32* 37* 46*  CREATININE 0.76 0.75 0.85 1.00 1.12*  CALCIUM 8.4 8.4 8.3* 7.9* 8.3*   Liver Function Tests: No results found for this basename: AST, ALT, ALKPHOS, BILITOT, PROT, ALBUMIN,  in the last 168 hours No results found for this basename: LIPASE, AMYLASE,  in the last 168 hours No results found for this basename: AMMONIA,  in the last 168 hours CBC:  Recent Labs Lab 03/31/14 0556 03/31/14 1809   WBC 11.0* 11.4*  NEUTROABS  --  8.1*  HGB 12.7 13.0  HCT 39.5 39.1  MCV 90.0 89.7  PLT 216 211   Cardiac Enzymes: No results found for this basename: CKTOTAL, CKMB, CKMBINDEX, TROPONINI,  in the last 168 hours BNP: BNP (last 3 results)  Recent Labs  03/26/14 2049  PROBNP 231.2   CBG:  Recent  Labs Lab 04/05/14 1219 04/05/14 1602 04/05/14 1958 04/05/14 2336 04/06/14 0647  GLUCAP 451* 341* 361* 392* 440*       Signed:  Illona Bulman  [facility] 04/06/2014, 10:16 AM

## 2014-04-06 NOTE — Progress Notes (Signed)
Patient is discharged from room 4N11 at this time.  Patient is going home with hospice care. Transported by [facility] via stretcher with belongings and son at side. Prescription given to son and nstructions read. Verbalized understanding.

## 2014-04-07 LAB — CULTURE, BLOOD (ROUTINE X 2)
CULTURE: NO GROWTH
Culture: NO GROWTH

## 2014-05-08 DEATH — deceased

## 2016-02-19 IMAGING — CR DG ABD PORTABLE 1V
1 series · 1 of 1 positions shown · non-contrast
Comparison: Portable exam 4029 hr compared to 03/26/2014

CLINICAL DATA: Abdominal pain

EXAM:
PORTABLE ABDOMEN - 1 VIEW

[AP]
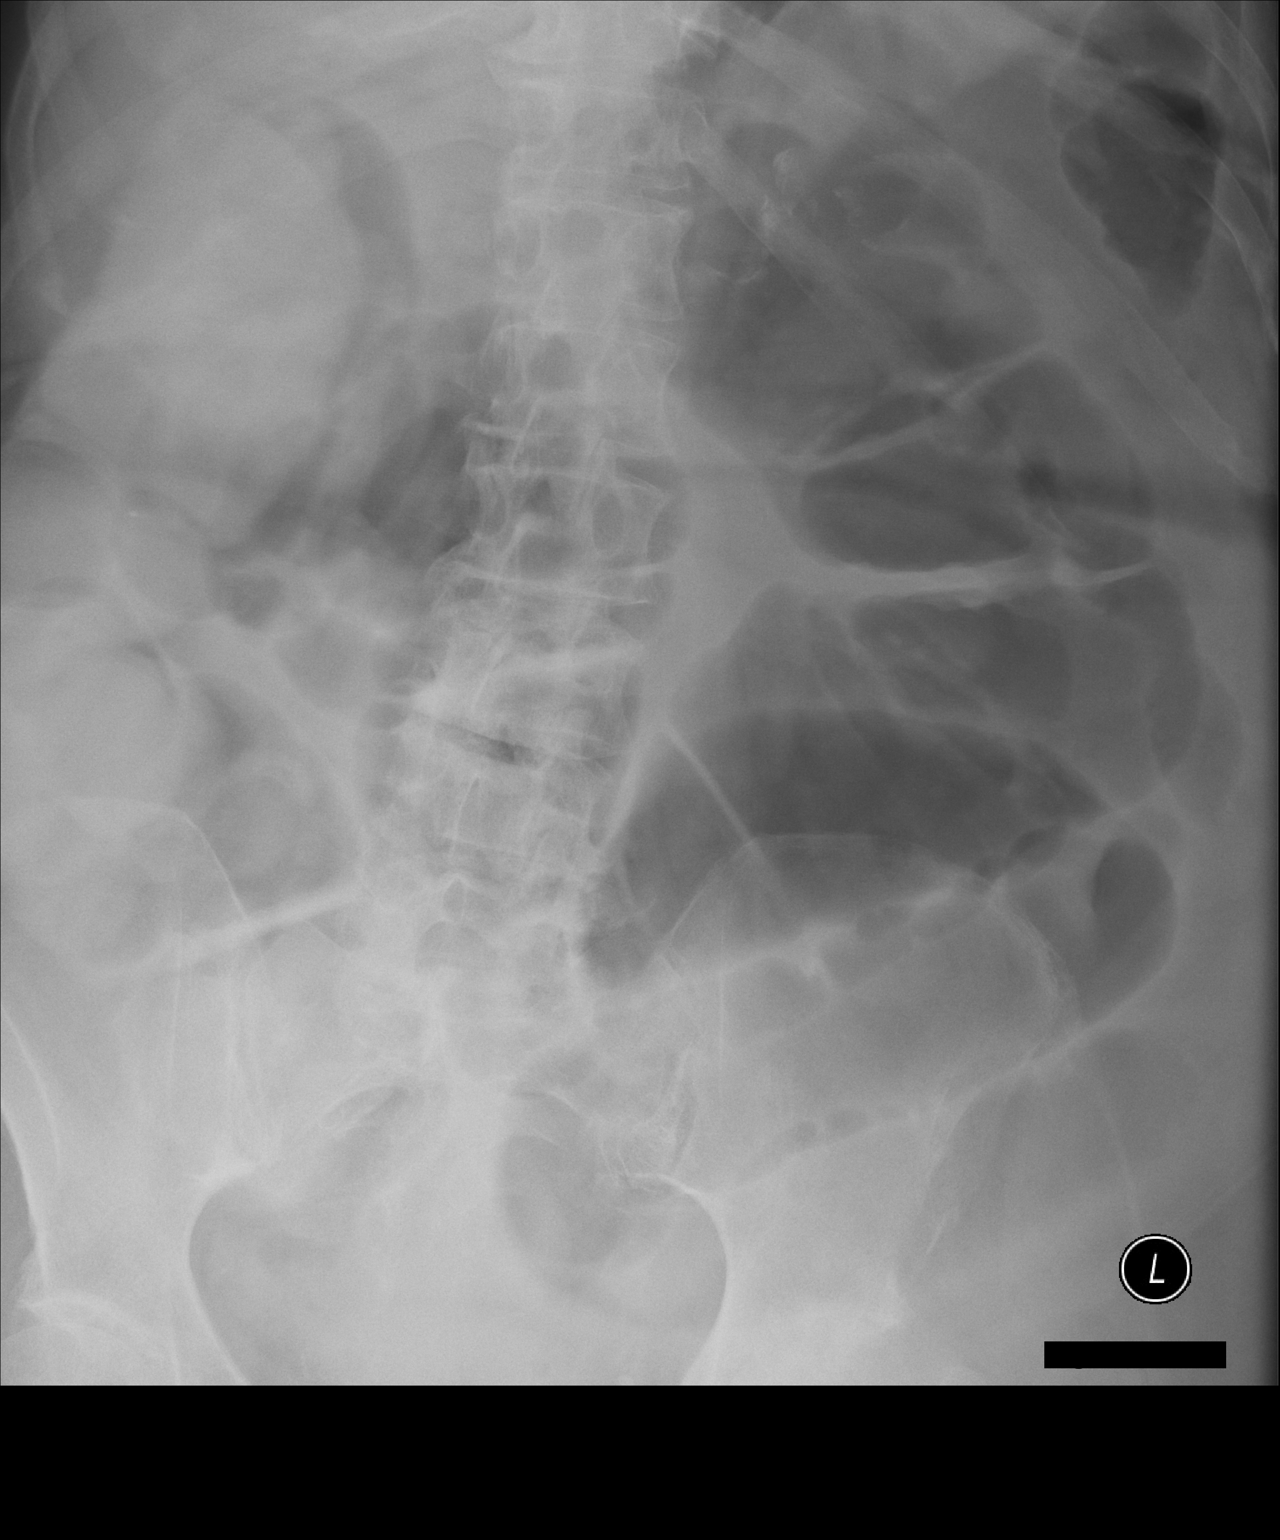

[1 of 1 positions shown; findings below may reference images not displayed]

FINDINGS: Gaseous distention of colon.

Retained contrast for stool in right colon.

No definite bowel wall thickening.

Additional air-filled loops of bowel in the a left lower quadrant
are nonspecific, could represent mildly dilated small bowel or
sigmoid colon.

No acute osseous findings or definite urinary tract calcification.
IMPRESSION: Air-filled loops of bowel throughout abdomen greater on left,
majority colonic though a few bowel loops in the left lower quadrant
are indeterminate for sigmoid colon versus small bowel.

No definite bowel wall thickening.

If patient has persistent symptoms, consider CT imaging to exclude
small bowel dilatation.

## 2016-02-22 IMAGING — CR DG CHEST 1V PORT
1 series · 1 of 1 positions shown · non-contrast
Comparison: 03/31/2014

CLINICAL DATA: Fever.  Rule out aspiration.

EXAM:
PORTABLE CHEST - 1 VIEW

[AP]
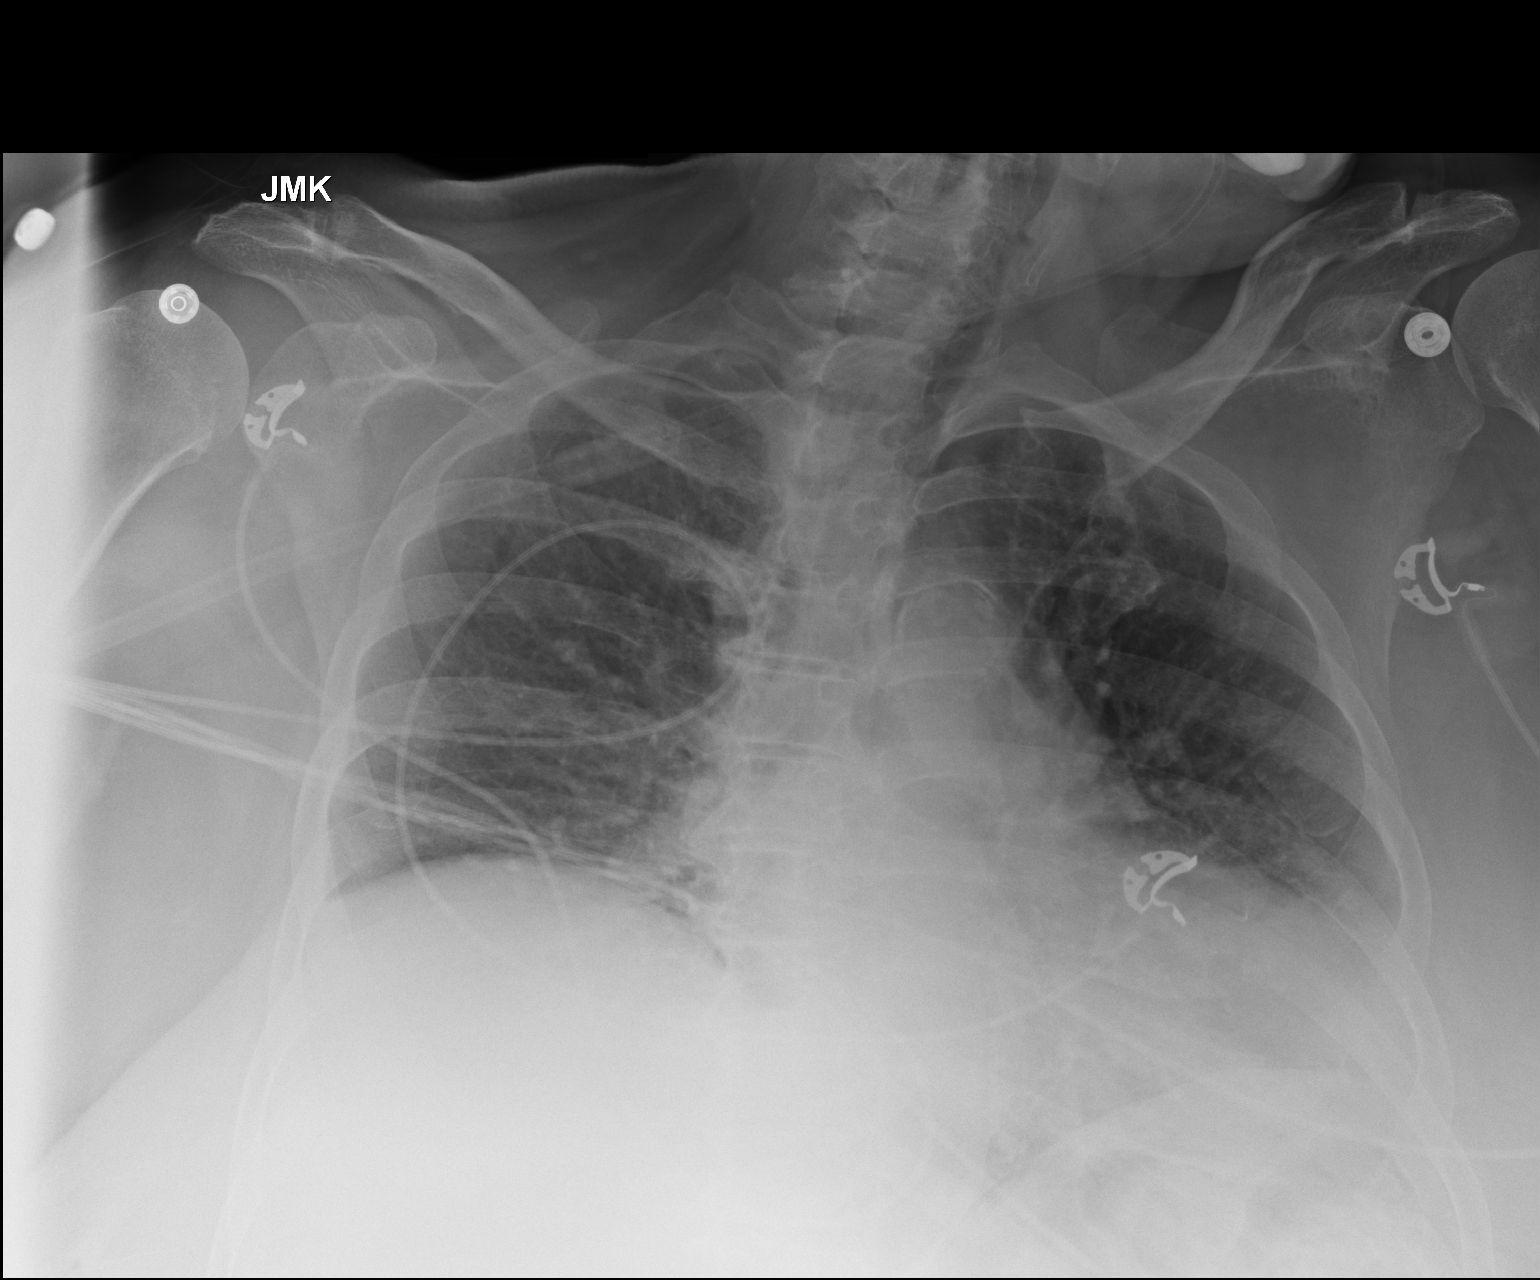

[1 of 1 positions shown; findings below may reference images not displayed]

FINDINGS: Lung volumes are low. There is minor basilar atelectasis, stable.
Lungs are otherwise clear. Cardiac silhouette is normal in size. No
mediastinal or hilar masses. No evidence of adenopathy.

Bony thorax is demineralized but grossly intact.
IMPRESSION: No acute cardiopulmonary disease.
